# Patient Record
Sex: Female | Born: 1960
Health system: Southern US, Community
[De-identification: ages and names within clinical notes are randomized; demographics above are authoritative.]

## PROBLEM LIST (undated history)

## (undated) DIAGNOSIS — E785 Hyperlipidemia, unspecified: Secondary | ICD-10-CM

## (undated) DIAGNOSIS — I10 Essential (primary) hypertension: Secondary | ICD-10-CM

## (undated) DIAGNOSIS — R7303 Prediabetes: Secondary | ICD-10-CM

## (undated) HISTORY — DX: Prediabetes: R73.03

## (undated) HISTORY — DX: Hyperlipidemia, unspecified: E78.5

## (undated) HISTORY — PX: OTHER SURGICAL HISTORY: SHX169

---

## 1999-06-19 ENCOUNTER — Other Ambulatory Visit: Admission: RE | Admit: 1999-06-19 | Discharge: 1999-06-19 | Payer: Self-pay | Admitting: Obstetrics and Gynecology

## 2001-03-26 ENCOUNTER — Other Ambulatory Visit: Admission: RE | Admit: 2001-03-26 | Discharge: 2001-03-26 | Payer: Self-pay | Admitting: Obstetrics and Gynecology

## 2002-03-28 ENCOUNTER — Other Ambulatory Visit: Admission: RE | Admit: 2002-03-28 | Discharge: 2002-03-28 | Payer: Self-pay | Admitting: Obstetrics and Gynecology

## 2003-09-18 ENCOUNTER — Other Ambulatory Visit: Admission: RE | Admit: 2003-09-18 | Discharge: 2003-09-18 | Payer: Self-pay | Admitting: Obstetrics and Gynecology

## 2003-09-28 ENCOUNTER — Encounter: Admission: RE | Admit: 2003-09-28 | Discharge: 2003-09-28 | Payer: Self-pay | Admitting: Obstetrics and Gynecology

## 2003-10-28 ENCOUNTER — Emergency Department (HOSPITAL_COMMUNITY): Admission: EM | Admit: 2003-10-28 | Discharge: 2003-10-28 | Payer: Self-pay | Admitting: Emergency Medicine

## 2004-12-23 ENCOUNTER — Other Ambulatory Visit: Admission: RE | Admit: 2004-12-23 | Discharge: 2004-12-23 | Payer: Self-pay | Admitting: Obstetrics and Gynecology

## 2006-02-13 ENCOUNTER — Emergency Department (HOSPITAL_COMMUNITY): Admission: EM | Admit: 2006-02-13 | Discharge: 2006-02-13 | Payer: Self-pay | Admitting: Emergency Medicine

## 2006-02-17 ENCOUNTER — Ambulatory Visit (HOSPITAL_COMMUNITY): Admission: RE | Admit: 2006-02-17 | Discharge: 2006-02-17 | Payer: Self-pay | Admitting: Emergency Medicine

## 2007-04-11 IMAGING — US US ABDOMEN COMPLETE
1 series · 14 of 25 positions shown · non-contrast
Comparison: None.

CLINICAL DATA: 45-year-old, abdominal pain, nausea and vomiting.
 ABDOMEN ULTRASOUND:
TECHNIQUE: Complete abdominal ultrasound examination was performed including evaluation of the liver, gallbladder, bile ducts, pancreas, kidneys, spleen, IVC, and abdominal aorta.

[Series 1: unknown · 0.30mm/px · 14 of 57 slices shown]
[im 1/57]
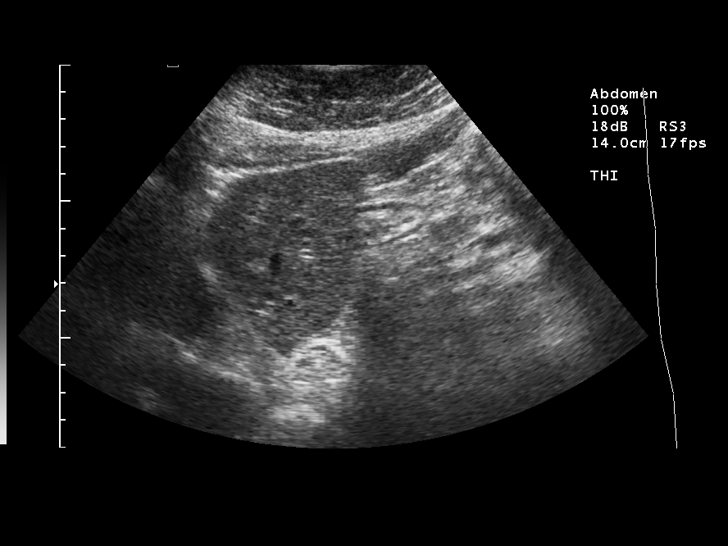
[im 5/57]
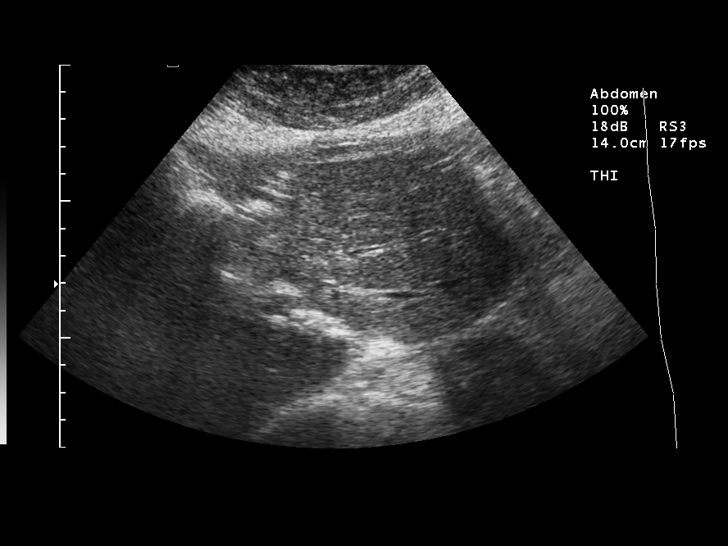
[im 10/57]
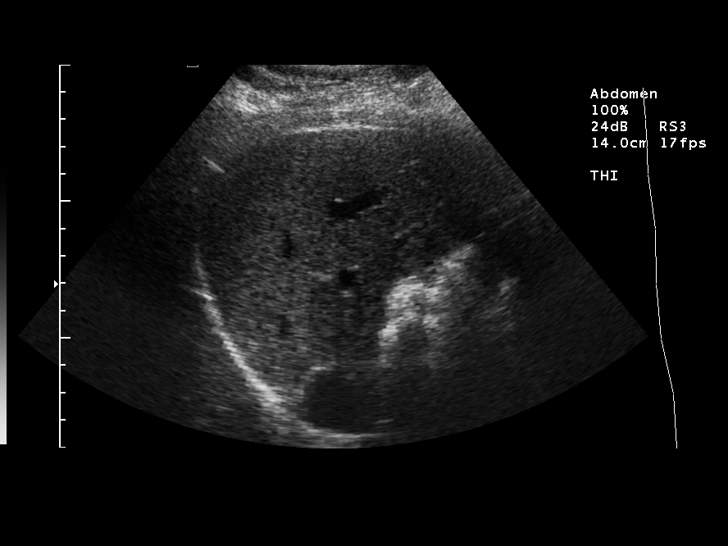
[im 15/57]
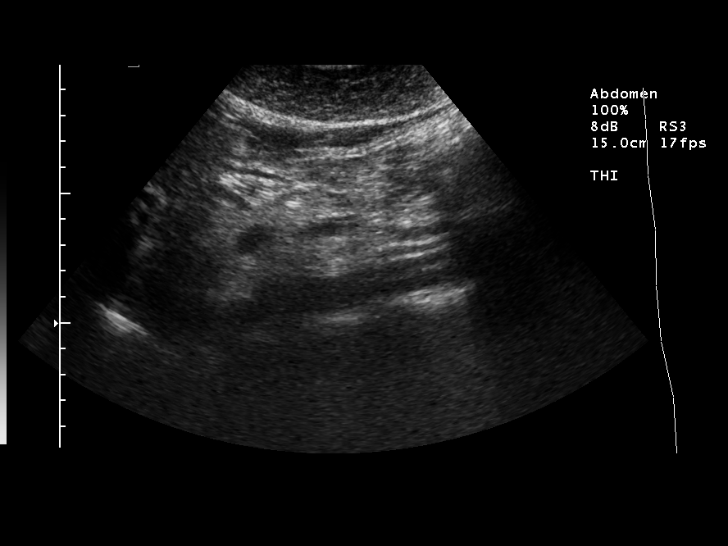
[im 19/57]
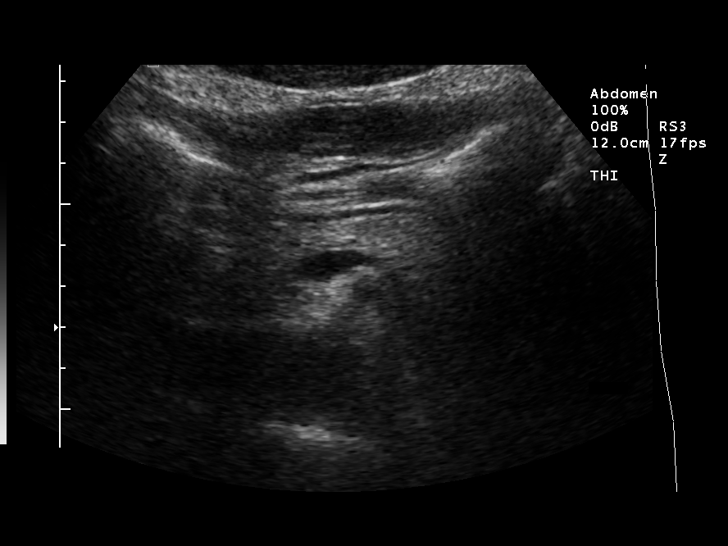
[im 22/57]
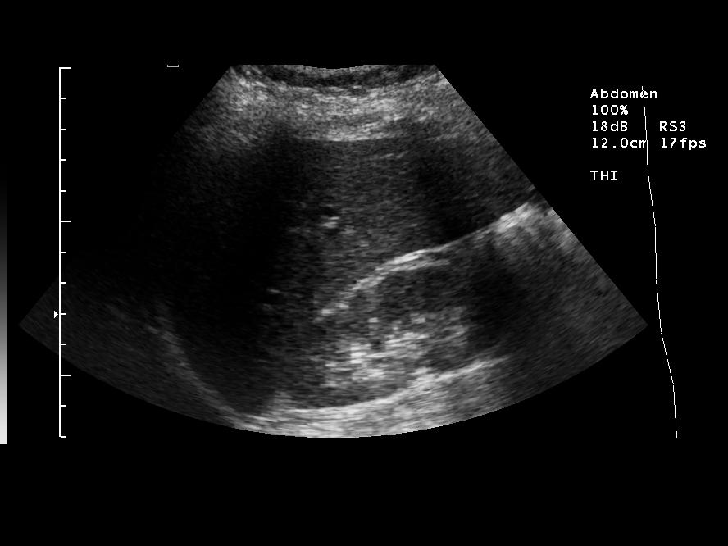
[im 26/57]
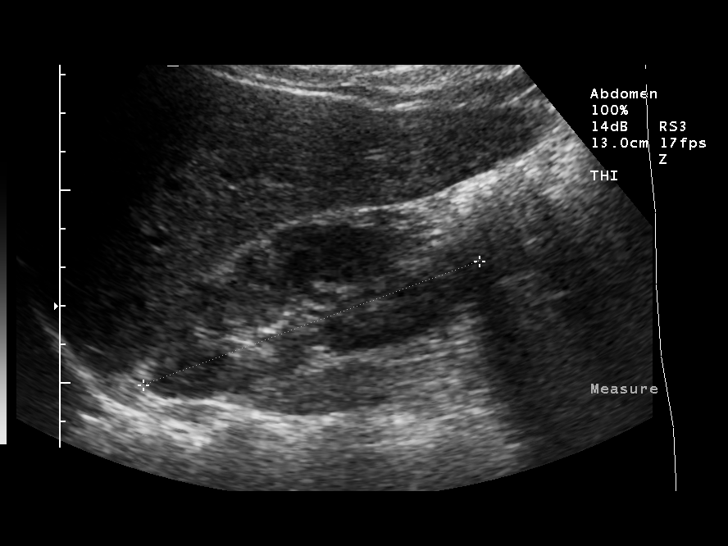
[im 31/57]
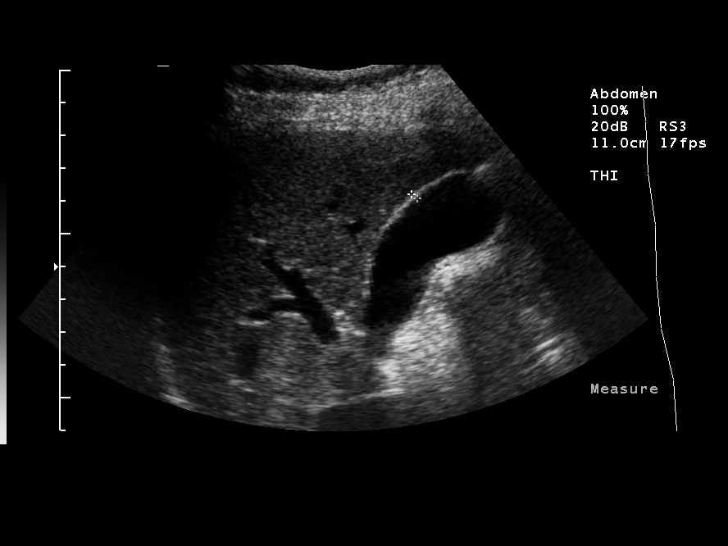
[im 36/57]
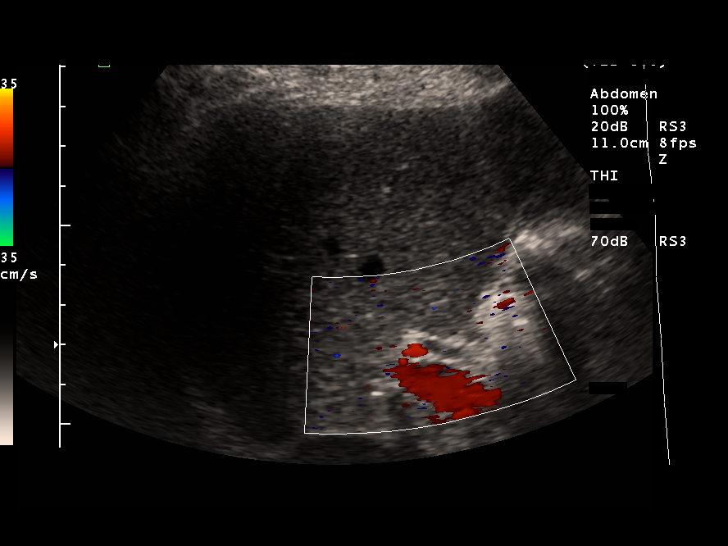
[im 38/57]
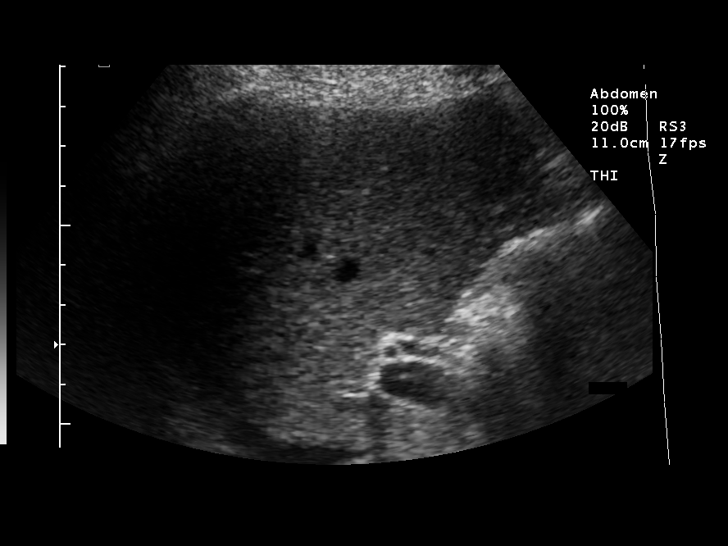
[im 43/57]
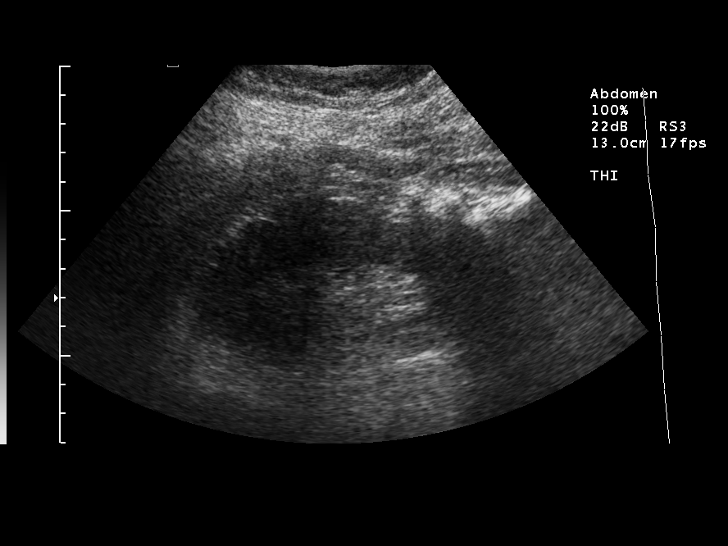
[im 47/57]
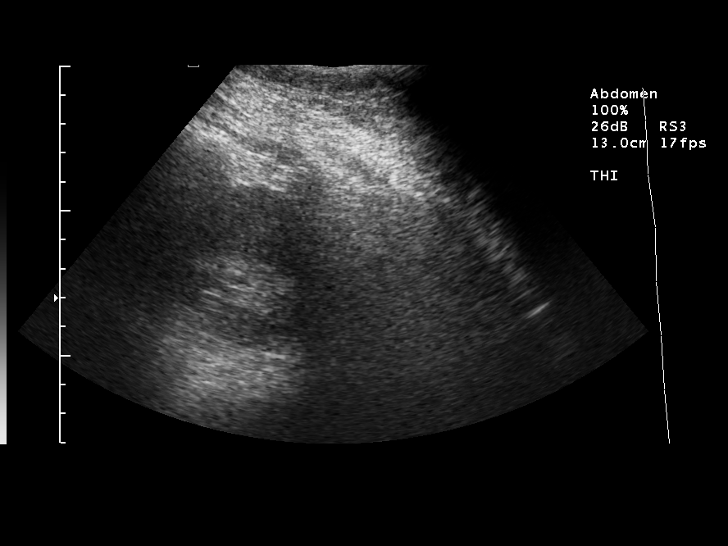
[im 52/57]
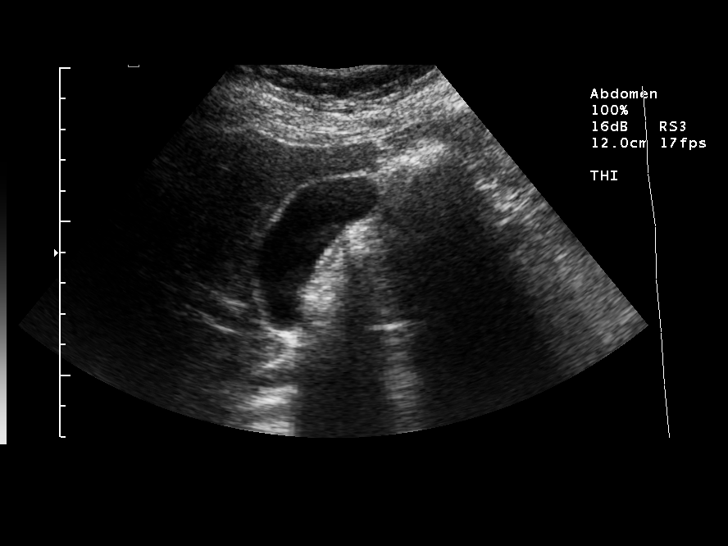
[im 57/57]
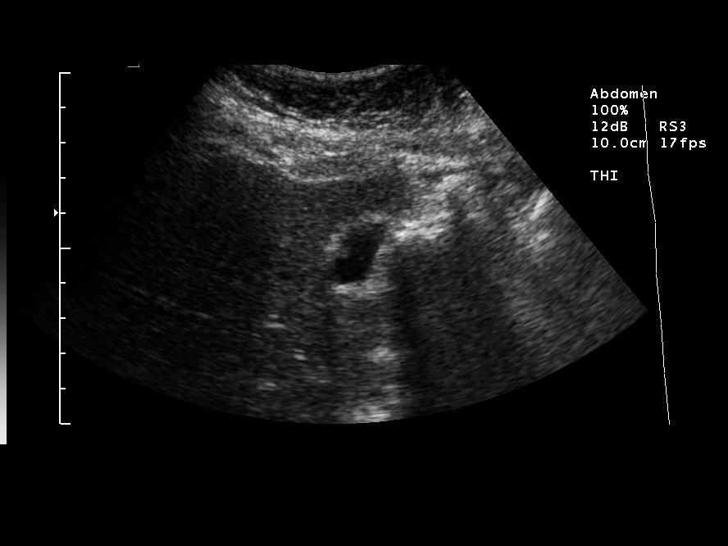

[14 of 25 positions shown; findings below may reference images not displayed]

FINDINGS: Liver is sonographically unremarkable.  No focal hepatic lesions or intrahepatic ductal dilatation.  The common bile duct measures 4.2 mm, which is within normal limits.  There is a small gallbladder polyp.  No gallstones, wall thickening, or pericholecystic fluid.  
 The IVC and the aorta are normal in caliber.  The pancreas is fairly well visualized and demonstrates no sonographic abnormalities.  
 Spleen measures 7.8 cm and demonstrates normal echogenicity.  The right kidney measures 9.3 cm and the left kidney measures 9.9 cm.  Both kidneys demonstrate normal echogenicity without focal lesions or hydronephrosis.  No perinephric fluid collections.
IMPRESSION: 1.  Unremarkable abdominal ultrasound examination.
 2.  Small gallbladder polyp.

## 2010-07-14 ENCOUNTER — Encounter: Payer: Self-pay | Admitting: Obstetrics and Gynecology

## 2011-07-01 ENCOUNTER — Other Ambulatory Visit: Payer: Self-pay | Admitting: Obstetrics and Gynecology

## 2011-07-01 DIAGNOSIS — Z1231 Encounter for screening mammogram for malignant neoplasm of breast: Secondary | ICD-10-CM

## 2011-08-11 ENCOUNTER — Ambulatory Visit
Admission: RE | Admit: 2011-08-11 | Discharge: 2011-08-11 | Disposition: A | Discharge status: Home / Self Care | Payer: 59 | Source: Ambulatory Visit | Attending: Obstetrics and Gynecology | Admitting: Obstetrics and Gynecology

## 2011-08-11 DIAGNOSIS — Z1231 Encounter for screening mammogram for malignant neoplasm of breast: Secondary | ICD-10-CM

## 2012-06-29 ENCOUNTER — Ambulatory Visit: Payer: Self-pay | Admitting: Obstetrics and Gynecology

## 2012-07-02 ENCOUNTER — Other Ambulatory Visit: Payer: Self-pay | Admitting: Obstetrics and Gynecology

## 2012-07-05 NOTE — Telephone Encounter (Signed)
PT NEED TO SCHEDULE AEX

## 2016-08-18 DIAGNOSIS — Z6841 Body Mass Index (BMI) 40.0 and over, adult: Secondary | ICD-10-CM | POA: Diagnosis not present

## 2016-08-18 DIAGNOSIS — L648 Other androgenic alopecia: Secondary | ICD-10-CM | POA: Diagnosis not present

## 2016-08-18 DIAGNOSIS — Z1211 Encounter for screening for malignant neoplasm of colon: Secondary | ICD-10-CM | POA: Diagnosis not present

## 2016-08-18 DIAGNOSIS — Z124 Encounter for screening for malignant neoplasm of cervix: Secondary | ICD-10-CM | POA: Diagnosis not present

## 2016-08-18 DIAGNOSIS — Z01411 Encounter for gynecological examination (general) (routine) with abnormal findings: Secondary | ICD-10-CM | POA: Diagnosis not present

## 2016-10-01 ENCOUNTER — Ambulatory Visit (INDEPENDENT_AMBULATORY_CARE_PROVIDER_SITE_OTHER): Payer: 59 | Admitting: Family Medicine

## 2016-10-01 VITALS — BP 167/89 | HR 67 | Temp 97.9°F | Resp 18 | Ht 58.25 in | Wt 208.0 lb

## 2016-10-01 DIAGNOSIS — M62838 Other muscle spasm: Secondary | ICD-10-CM

## 2016-10-01 MED ORDER — BACLOFEN 10 MG PO TABS: 10.0000 mg | ORAL_TABLET | Freq: Three times a day (TID) | ORAL | 0 refills | Status: DC | PRN

## 2016-10-01 NOTE — Progress Notes (Signed)
   Nancy Osborne is a 56 y.o. female who presents to Primary Care at [facility] today for upper back pain  1.  Back pain: Monday she noted pain in upper back and shoulders. Feels like pulling and stretching. Pain is 9/10, with Aleve it goes down to a 2/10.  She notes rotating her Osborne makes the pain worse, she feels slightly warm and diaphoretic during this movement. No radiation of pain.  No weakness/numbness of arms.  No pain at night, only during the daytime when she's active.  She works in a [employer] in June 2017.  Hasn't tried anything else for the pain.   ROS as above.  Pertinently, no chest pain, palpitations, SOB, Fever, Chills, Abd pain, N/V/D.   PMH reviewed. No significant PMHx, pt denies h/o HTN and notes elevation of BP today due to pain. Notes BP normal at gyn appt in Jan of this year.   Social: Patient is a nonsmoker, no drug use, no alcohol use.  Surgical: no surgical history  Family healthy  Medications reviewed. Current Outpatient Prescriptions  Medication Sig Dispense Refill  . naproxen sodium (ANAPROX) 220 MG tablet Take 220 mg by mouth 2 (two) times daily with a meal.    . baclofen (LIORESAL) 10 MG tablet Take 1 tablet (10 mg total) by mouth 3 (three) times daily as needed for muscle spasms. 30 each 0   No current facility-administered medications for this visit.      Physical Exam:  BP (!) 167/89   Pulse 67   Temp 97.9 F (36.6 C) (Oral)   Resp 18   Ht 4' 10.25" (1.48 m)   Wt 208 lb (94.3 kg)   LMP 07/21/2011   SpO2 97%   BMI 43.10 kg/m  Gen:  Alert, cooperative patient who appears stated age in no acute distress.  Vital signs reviewed. HEENT: EOMI,  MMM Pulm:  Clear to auscultation bilaterally with good air movement.  No wheezes or rales noted.   Cardiac:  Regular rate and rhythm without murmur auscultated.  Good S1/S2. Abd:  Soft/nondistended/nontender.  Good bowel sounds throughout all four quadrants.  No masses noted.  Exts:  Non edematous BL  LE, warm and well perfused.  Back: normal to inspection. Mild tenderness and spacticity to overlying midline tissue and cervical paraspinal muscles. Full ROM in shoulders. Pain with raising arms in front of her, but not with abduction. 5/5 strength in UEs. Normal/symmetric biceps reflexes. Sensation intact. Negative Spurlings.   Assessment and Plan:  1.  Previously healthy 56 y/o presenting for upper back pain that is pulling/stretching in nature. Exam consistent with muscle spasms. No red flags on history or exam. Despite her feelings of warmth, no evidence of infection (no overlying warmth, tenderness) Discussed stretches, heat and cold treatment, rest, NSAIDs as needed. Rx for baclofen, advised it makes some drowsy so she should initially take it in the evening to see how it affects her. Return precautions discussed.  2. Elevated BP: new per patient. Asymptomatic. Most likely secondary to pain. Advised her to f/u with primary care physician to monitor BP once pain has improved.  Joanna Puff, MD [facility] Resident  10/01/2016, 5:06 PM

## 2016-10-01 NOTE — Patient Instructions (Signed)
Continue to take Aleve as needed for pain.  I have prescribed baclofen to help with the muscle spasms. This medication can make some people drowsy, so take it at night for the first time to see how it affects to.  Continue to do the stretches that I showed you.  Applying heat may help with the pain as well.  If your symptoms felt to improve or worsen, follow-up with [place].  Continue to monitor your blood pressure, if you noted does not improve with improvement in your pain please follow-up with her primary care doctor.   Spasticity Spasticity is a condition in which your muscles contract suddenly and unpredictably (spasm). Spasticity usually affects your arms, legs, or back. It can also affect your speech and the way you walk. Spasticity can range from mild muscle stiffness and tightness to severe, uncontrollable muscle spasms. Severe spasticity can be painful and can freeze your muscles in an uncomfortable position. Follow these instructions at home: Watch your condition for any changes. The following actions may help to lessen any discomfort you are feeling:  Stay active.  Maintain good posture when walking and sitting.  Work with a physical therapist.  Do stretching and range of motion exercises at home as told by a physical therapist.  Work with an occupational therapist. This type of health care provider can help you function better at home and at work.  Wear a brace as told by your health care provider to prevent muscle contractions.  Have the affected muscles massaged.  If directed, apply ice to the affected muscle area:  Put ice in a plastic bag.  Place a towel between your skin and the bag or between your brace and the bag.  Leave the ice on for 20 minutes, 2?3 times a day. Contact a health care provider if:   Your muscle spasms get worse.  You develop other symptoms along with spasticity.  You have a fever or chills.  You have trouble passing urine or you  experience a burning feeling when you pass urine.  You become constipated.  You need more support at home. Get help right away if:  You have trouble breathing.  You have a muscle spasm that freezes you into a painful position.  You cannot walk.  You cannot care for yourself at home. This information is not intended to replace advice given to you by your health care provider. Make sure you discuss any questions you have with your health care provider. Document Released: 05/30/2002 Document Revised: 02/04/2016 Document Reviewed: 08/23/2013 Elsevier Interactive Patient Education  2017 [employer].

## 2016-10-21 ENCOUNTER — Encounter: Payer: Self-pay | Admitting: Student

## 2016-10-21 ENCOUNTER — Ambulatory Visit (INDEPENDENT_AMBULATORY_CARE_PROVIDER_SITE_OTHER): Payer: 59 | Admitting: Student

## 2016-10-21 VITALS — BP 178/102 | HR 70 | Temp 98.5°F | Resp 16 | Ht 58.5 in | Wt 206.0 lb

## 2016-10-21 DIAGNOSIS — Z131 Encounter for screening for diabetes mellitus: Secondary | ICD-10-CM | POA: Insufficient documentation

## 2016-10-21 DIAGNOSIS — Z13220 Encounter for screening for lipoid disorders: Secondary | ICD-10-CM | POA: Diagnosis not present

## 2016-10-21 DIAGNOSIS — I10 Essential (primary) hypertension: Secondary | ICD-10-CM

## 2016-10-21 LAB — POCT URINALYSIS DIP (MANUAL ENTRY)
Bilirubin, UA: NEGATIVE
Blood, UA: NEGATIVE
Glucose, UA: NEGATIVE mg/dL
Ketones, POC UA: NEGATIVE mg/dL
Leukocytes, UA: NEGATIVE
Nitrite, UA: NEGATIVE
PH UA: 7 (ref 5.0–8.0)
PROTEIN UA: NEGATIVE mg/dL
SPEC GRAV UA: 1.02 (ref 1.010–1.025)
UROBILINOGEN UA: 1 U/dL

## 2016-10-21 MED ORDER — HYDROCHLOROTHIAZIDE 25 MG PO TABS: 25.0000 mg | ORAL_TABLET | Freq: Every day | ORAL | 3 refills | Status: DC

## 2016-10-21 NOTE — Progress Notes (Signed)
   Subjective:    Patient ID: Nancy Osborne, female    DOB: 20-Dec-1960, 56 y.o.   MRN: [id number]  HPI Follow up for having high blood pressure on her last visit.   She denies chest pain, shortness of breath, visual changes, weakness, numbness, tingling, palpitations.  She was seen at her last visit here and told to come back for BP recheck.  Had a job interview and found to have BP in the 200's systolic.  She denies ever having symptoms.   She cooks mostly at home and does not add salt.  She is obese and wants to lose weight.  She use to have her lab work checked at another office and never had abnormal lab results.  She denies having diabetes and did have her A1c checked last year.     PMHx - Updated and reviewed.  Contributory factors include: Negative PSHx - Updated and reviewed.  Contributory factors include:  Negative FHx - Updated and reviewed.  Contributory factors include:  Negative Social Hx - Updated and reviewed. Contributory factors include: nonsmoker, drinks occasionally, no recreational drug use Medications - reviewed   Review of Systems  Constitutional: Negative for chills, fatigue and fever.  Eyes: Negative for pain and visual disturbance.  Respiratory: Negative for cough, chest tightness and shortness of breath.   Cardiovascular: Negative for chest pain, palpitations and leg swelling.  Gastrointestinal: Negative for abdominal pain and nausea.  Genitourinary: Negative for dysuria and urgency.  Musculoskeletal: Negative for arthralgias and joint swelling.  Skin: Negative for rash and wound.  Neurological: Negative for dizziness, syncope, weakness and numbness.  Psychiatric/Behavioral: Negative for agitation and confusion.  All other systems reviewed and are negative.      Objective:   Physical Exam  Constitutional: She is oriented to person, place, and time. She appears well-developed and well-nourished. No distress.  HENT:  Head: Normocephalic and atraumatic.  Right  Ear: External ear normal.  Left Ear: External ear normal.  Eyes: Conjunctivae are normal. Pupils are equal, round, and reactive to light.  Neck: Normal range of motion. Neck supple. No JVD present.  Cardiovascular: Normal rate, regular rhythm, normal heart sounds and intact distal pulses.  Exam reveals no gallop and no friction rub.   No murmur heard. Pulmonary/Chest: Effort normal and breath sounds normal. No respiratory distress. She has no wheezes. She has no rales. She exhibits no tenderness.  Musculoskeletal: Normal range of motion. She exhibits no edema.  Neurological: She is alert and oriented to person, place, and time.  Skin: Skin is warm. No rash noted. She is not diaphoretic. No erythema.  Psychiatric: She has a normal mood and affect. Her behavior is normal. Judgment and thought content normal.  Nursing note and vitals reviewed.  BP (!) 178/102   Pulse 70   Temp 98.5 F (36.9 C) (Oral)   Resp 16   Ht 4' 10.5" (1.486 m)   Wt 206 lb (93.4 kg)   LMP 07/21/2011   SpO2 98%   BMI 42.32 kg/m         Assessment & Plan:  Essential hypertension Recommend starting HCTZ at this time.  She will get screening labs and follow up in 2 weeks to recheck kidneys and response to medication.  Diet reviewed. Wrote note to work, no heavy lifting.  May need to add second medication if not controlled in 2 weeks.  Signed,  Corliss Marcus, DO  Sports Medicine [facility] 5:21 PM 10/21/16

## 2016-10-21 NOTE — Assessment & Plan Note (Addendum)
Recommend starting HCTZ at this time.  She will get screening labs and follow up in 2 weeks to recheck kidneys and response to medication.  Diet reviewed. Wrote note to work, no heavy lifting.  May need to add second medication if not controlled in 2 weeks. Reviewed disease process and prevention of tertiary illness.

## 2016-10-21 NOTE — Patient Instructions (Addendum)
   IF you received an x-ray today, you will receive an invoice from [facility]. Please contact [facility] at [phone] with questions or concerns regarding your invoice.   IF you received labwork today, you will receive an invoice from [facility]. Please contact [facility] at [phone] with questions or concerns regarding your invoice.   Our billing staff will not be able to assist you with questions regarding bills from these companies.  You will be contacted with the lab results as soon as they are available. The fastest way to get your results is to activate your My Chart account. Instructions are located on the last page of this paperwork. If you have not heard from us regarding the results in 2 weeks, please contact this office.     DASH Eating Plan DASH stands for "Dietary Approaches to Stop Hypertension." The DASH eating plan is a healthy eating plan that has been shown to reduce high blood pressure (hypertension). It may also reduce your risk for type 2 diabetes, heart disease, and stroke. The DASH eating plan may also help with weight loss. What are tips for following this plan? General guidelines   Avoid eating more than 2,300 mg (milligrams) of salt (sodium) a day. If you have hypertension, you may need to reduce your sodium intake to 1,500 mg a day.  Limit alcohol intake to no more than 1 drink a day for nonpregnant women and 2 drinks a day for men. One drink equals 12 oz of beer, 5 oz of wine, or 1 oz of hard liquor.  Work with your health care provider to maintain a healthy body weight or to lose weight. Ask what an ideal weight is for you.  Get at least 30 minutes of exercise that causes your heart to beat faster (aerobic exercise) most days of the week. Activities may include walking, swimming, or biking.  Work with your health care provider or diet and nutrition specialist (dietitian) to adjust your eating plan to your individual calorie  needs. Reading food labels   Check food labels for the amount of sodium per serving. Choose foods with less than 5 percent of the Daily Value of sodium. Generally, foods with less than 300 mg of sodium per serving fit into this eating plan.  To find whole grains, look for the word "whole" as the first word in the ingredient list. Shopping   Buy products labeled as "low-sodium" or "no salt added."  Buy fresh foods. Avoid canned foods and premade or frozen meals. Cooking   Avoid adding salt when cooking. Use salt-free seasonings or herbs instead of table salt or sea salt. Check with your health care provider or pharmacist before using salt substitutes.  Do not fry foods. Cook foods using healthy methods such as baking, boiling, grilling, and broiling instead.  Cook with heart-healthy oils, such as olive, canola, soybean, or sunflower oil. Meal planning    Eat a balanced diet that includes:  5 or more servings of fruits and vegetables each day. At each meal, try to fill half of your plate with fruits and vegetables.  Up to 6-8 servings of whole grains each day.  Less than 6 oz of lean meat, poultry, or fish each day. A 3-oz serving of meat is about the same size as a deck of cards. One egg equals 1 oz.  2 servings of low-fat dairy each day.  A serving of nuts, seeds, or beans 5 times each week.  Heart-healthy fats. Healthy fats called   Omega-3 fatty acids are found in foods such as flaxseeds and coldwater fish, like sardines, salmon, and mackerel.  Limit how much you eat of the following:  Canned or prepackaged foods.  Food that is high in trans fat, such as fried foods.  Food that is high in saturated fat, such as fatty meat.  Sweets, desserts, sugary drinks, and other foods with added sugar.  Full-fat dairy products.  Do not salt foods before eating.  Try to eat at least 2 vegetarian meals each week.  Eat more home-cooked food and less restaurant, buffet, and fast  food.  When eating at a restaurant, ask that your food be prepared with less salt or no salt, if possible. What foods are recommended? The items listed may not be a complete list. Talk with your dietitian about what dietary choices are best for you. Grains  Whole-grain or whole-wheat bread. Whole-grain or whole-wheat pasta. Brown rice. Oatmeal. Quinoa. Bulgur. Whole-grain and low-sodium cereals. Pita bread. Low-fat, low-sodium crackers. Whole-wheat flour tortillas. Vegetables  Fresh or frozen vegetables (raw, steamed, roasted, or grilled). Low-sodium or reduced-sodium tomato and vegetable juice. Low-sodium or reduced-sodium tomato sauce and tomato paste. Low-sodium or reduced-sodium canned vegetables. Fruits  All fresh, dried, or frozen fruit. Canned fruit in natural juice (without added sugar). Meat and other protein foods  Skinless chicken or turkey. Ground chicken or turkey. Pork with fat trimmed off. Fish and seafood. Egg whites. Dried beans, peas, or lentils. Unsalted nuts, nut butters, and seeds. Unsalted canned beans. Lean cuts of beef with fat trimmed off. Low-sodium, lean deli meat. Dairy  Low-fat (1%) or fat-free (skim) milk. Fat-free, low-fat, or reduced-fat cheeses. Nonfat, low-sodium ricotta or cottage cheese. Low-fat or nonfat yogurt. Low-fat, low-sodium cheese. Fats and oils  Soft margarine without trans fats. Vegetable oil. Low-fat, reduced-fat, or light mayonnaise and salad dressings (reduced-sodium). Canola, safflower, olive, soybean, and sunflower oils. Avocado. Seasoning and other foods  Herbs. Spices. Seasoning mixes without salt. Unsalted popcorn and pretzels. Fat-free sweets. What foods are not recommended? The items listed may not be a complete list. Talk with your dietitian about what dietary choices are best for you. Grains  Baked goods made with fat, such as croissants, muffins, or some breads. Dry pasta or rice meal packs. Vegetables  Creamed or fried vegetables.  Vegetables in a cheese sauce. Regular canned vegetables (not low-sodium or reduced-sodium). Regular canned tomato sauce and paste (not low-sodium or reduced-sodium). Regular tomato and vegetable juice (not low-sodium or reduced-sodium). Pickles. Olives. Fruits  Canned fruit in a light or heavy syrup. Fried fruit. Fruit in cream or butter sauce. Meat and other protein foods  Fatty cuts of meat. Ribs. Fried meat. Bacon. Sausage. Bologna and other processed lunch meats. Salami. Fatback. Hotdogs. Bratwurst. Salted nuts and seeds. Canned beans with added salt. Canned or smoked fish. Whole eggs or egg yolks. Chicken or turkey with skin. Dairy  Whole or 2% milk, cream, and half-and-half. Whole or full-fat cream cheese. Whole-fat or sweetened yogurt. Full-fat cheese. Nondairy creamers. Whipped toppings. Processed cheese and cheese spreads. Fats and oils  Butter. Stick margarine. Lard. Shortening. Ghee. Bacon fat. Tropical oils, such as coconut, palm kernel, or palm oil. Seasoning and other foods  Salted popcorn and pretzels. Onion salt, garlic salt, seasoned salt, table salt, and sea salt. Worcestershire sauce. Tartar sauce. Barbecue sauce. Teriyaki sauce. Soy sauce, including reduced-sodium. Steak sauce. Canned and packaged gravies. Fish sauce. Oyster sauce. Cocktail sauce. Horseradish that you find on the shelf. Ketchup. Mustard. Meat flavorings   and tenderizers. Bouillon cubes. Hot sauce and Tabasco sauce. Premade or packaged marinades. Premade or packaged taco seasonings. Relishes. Regular salad dressings. Where to find more information:  National Heart, Lung, and Blood Institute: www.nhlbi.nih.gov  American Heart Association: www.heart.org Summary  The DASH eating plan is a healthy eating plan that has been shown to reduce high blood pressure (hypertension). It may also reduce your risk for type 2 diabetes, heart disease, and stroke.  With the DASH eating plan, you should limit salt (sodium) intake  to 2,300 mg a day. If you have hypertension, you may need to reduce your sodium intake to 1,500 mg a day.  When on the DASH eating plan, aim to eat more fresh fruits and vegetables, whole grains, lean proteins, low-fat dairy, and heart-healthy fats.  Work with your health care provider or diet and nutrition specialist (dietitian) to adjust your eating plan to your individual calorie needs. This information is not intended to replace advice given to you by your health care provider. Make sure you discuss any questions you have with your health care provider. Document Released: 05/29/2011 Document Revised: 06/02/2016 Document Reviewed: 06/02/2016 Elsevier Interactive Patient Education  2017 [employer]  

## 2016-10-22 LAB — COMPREHENSIVE METABOLIC PANEL
A/G RATIO: 1.4 (ref 1.2–2.2)
ALK PHOS: 108 IU/L (ref 39–117)
ALT: 23 IU/L (ref 0–32)
AST: 22 IU/L (ref 0–40)
Albumin: 4.3 g/dL (ref 3.5–5.5)
BILIRUBIN TOTAL: 0.4 mg/dL (ref 0.0–1.2)
BUN/Creatinine Ratio: 15 (ref 9–23)
BUN: 14 mg/dL (ref 6–24)
CALCIUM: 9.8 mg/dL (ref 8.7–10.2)
CHLORIDE: 98 mmol/L (ref 96–106)
CO2: 28 mmol/L (ref 18–29)
Creatinine, Ser: 0.92 mg/dL (ref 0.57–1.00)
GFR calc Af Amer: 80 mL/min/{1.73_m2} (ref >59)
GFR, EST NON AFRICAN AMERICAN: 70 mL/min/{1.73_m2} (ref >59)
GLOBULIN, TOTAL: 3.1 g/dL (ref 1.5–4.5)
GLUCOSE: 99 mg/dL (ref 65–99)
POTASSIUM: 3.9 mmol/L (ref 3.5–5.2)
SODIUM: 142 mmol/L (ref 134–144)
TOTAL PROTEIN: 7.4 g/dL (ref 6.0–8.5)

## 2016-10-22 LAB — CBC WITH DIFFERENTIAL/PLATELET
BASOS ABS: 0 10*3/uL (ref 0.0–0.2)
BASOS: 0 %
EOS (ABSOLUTE): 0.1 10*3/uL (ref 0.0–0.4)
Eos: 1 %
Hematocrit: 40.7 % (ref 34.0–46.6)
Hemoglobin: 13.2 g/dL (ref 11.1–15.9)
IMMATURE GRANS (ABS): 0 10*3/uL (ref 0.0–0.1)
IMMATURE GRANULOCYTES: 0 %
LYMPHS: 36 %
Lymphocytes Absolute: 2.3 10*3/uL (ref 0.7–3.1)
MCH: 26.7 pg (ref 26.6–33.0)
MCHC: 32.4 g/dL (ref 31.5–35.7)
MCV: 82 fL (ref 79–97)
MONOS ABS: 0.4 10*3/uL (ref 0.1–0.9)
Monocytes: 6 %
NEUTROS PCT: 57 %
Neutrophils Absolute: 3.6 10*3/uL (ref 1.4–7.0)
PLATELETS: 327 10*3/uL (ref 150–379)
RBC: 4.94 x10E6/uL (ref 3.77–5.28)
RDW: 15.7 % — AB (ref 12.3–15.4)
WBC: 6.4 10*3/uL (ref 3.4–10.8)

## 2016-10-22 LAB — HEMOGLOBIN A1C
Est. average glucose Bld gHb Est-mCnc: 131 mg/dL
HEMOGLOBIN A1C: 6.2 % — AB (ref 4.8–5.6)

## 2016-10-22 LAB — LIPID PANEL
CHOL/HDL RATIO: 2.7 ratio (ref 0.0–4.4)
CHOLESTEROL TOTAL: 223 mg/dL — AB (ref 100–199)
HDL: 82 mg/dL (ref >39)
LDL CALC: 131 mg/dL — AB (ref 0–99)
Triglycerides: 48 mg/dL (ref 0–149)
VLDL Cholesterol Cal: 10 mg/dL (ref 5–40)

## 2016-10-27 ENCOUNTER — Telehealth: Payer: Self-pay

## 2016-10-27 NOTE — Telephone Encounter (Signed)
PATIENT SAW DR. Cardell PeachALICIA CHITANAND LAST TUES. (10/21/16) FOR HER HIGH BLOOD PRESSURE. SHE NEEDED TO HAVE A NOTE FAXED TO HER [employer] SAYING SHE IS UNDER A DOCTOR'S CARE FOR HER BLOOD PRESSURE. INSTEAD THE NOTE PUT HER ON RESTRICTIONS. SHE IS TRYING TO START THIS NEW JOB ON MON. 11/03/16 AND WITH THESE RESTRICTIONS IT IS HOLDING HER UP FROM GETTING THE JOB. PLEASE FAX A NEW NOTE TO HER COMPANY. FAX # [phone] (ATTN) RONDA MORRIS AT [facility] PLEASE CALL THE PATIENT WHEN IT HAS BEEN FAXED [phone] (CELL) MBC

## 2016-10-27 NOTE — Telephone Encounter (Signed)
Will  You write in your care but no restrictions?

## 2016-10-28 NOTE — Telephone Encounter (Signed)
Pt advised and tx up front for appt Saturday for lab and b/p check

## 2016-10-28 NOTE — Telephone Encounter (Signed)
Lenward ChancellorHey Karen,  I will lift restrictions if she comes back for a BP recheck and follow up labs.  Can she come in for an appt to get BP recheck and labs this week?  I do not think she is safe to lift heavy objects until her BP comes down.  But I am sure it is better with BP meds, but want to be sure before being cleared.

## 2016-11-01 ENCOUNTER — Ambulatory Visit (INDEPENDENT_AMBULATORY_CARE_PROVIDER_SITE_OTHER): Payer: 59 | Admitting: Physician Assistant

## 2016-11-01 ENCOUNTER — Encounter: Payer: Self-pay | Admitting: Physician Assistant

## 2016-11-01 VITALS — BP 178/118 | HR 95 | Temp 98.8°F | Resp 17 | Ht <= 58 in | Wt 206.2 lb

## 2016-11-01 DIAGNOSIS — I10 Essential (primary) hypertension: Secondary | ICD-10-CM | POA: Diagnosis not present

## 2016-11-01 MED ORDER — AMLODIPINE BESYLATE 10 MG PO TABS: 10.0000 mg | ORAL_TABLET | Freq: Every day | ORAL | 0 refills | Status: DC

## 2016-11-01 NOTE — Patient Instructions (Signed)
     IF you received an x-ray today, you will receive an invoice from [facility]. Please contact  Radiology at [phone] with questions or concerns regarding your invoice.   IF you received labwork today, you will receive an invoice from [facility]. Please contact [facility] at [phone] with questions or concerns regarding your invoice.   Our billing staff will not be able to assist you with questions regarding bills from these companies.  You will be contacted with the lab results as soon as they are available. The fastest way to get your results is to activate your My Chart account. Instructions are located on the last page of this paperwork. If you have not heard from us regarding the results in 2 weeks, please contact this office.     

## 2016-11-01 NOTE — Progress Notes (Signed)
   Nancy SinghDoris N Osborne  MRN: [id number] DOB: [birth year]  PCP: Nancy RiddleWeber, Duell Holdren L, PA-C  Chief Complaint  Patient presents with  . Hypertension    Subjective:  Pt presents to clinic for HTN evaluation.  She as seen on 5/1 for elevated BP and she was started on HCTZ at that visit.  She had not noticed much change in her BP but she has been urinating a lot.  She does not feel bad and does not have HA, nausea or vision changes or BP or SOB.  She has a family history with her mother and father of HTN.  She cooks at home and does not add salt.    Review of Systems  Constitutional: Negative for chills and fever.  Respiratory: Negative for shortness of breath.   Cardiovascular: Negative for chest pain, palpitations and leg swelling.  Neurological: Negative for headaches.    Patient Active Problem List   Diagnosis Date Noted  . Essential hypertension 10/21/2016  . Screening for diabetes mellitus 10/21/2016    Current Outpatient Prescriptions on File Prior to Visit  Medication Sig Dispense Refill  . baclofen (LIORESAL) 10 MG tablet Take 1 tablet (10 mg total) by mouth 3 (three) times daily as needed for muscle spasms. 30 each 0  . hydrochlorothiazide (HYDRODIURIL) 25 MG tablet Take 1 tablet (25 mg total) by mouth daily. 90 tablet 3  . naproxen sodium (ANAPROX) 220 MG tablet Take 220 mg by mouth 2 (two) times daily with a meal.     No current facility-administered medications on file prior to visit.     No Known Allergies  Pt patients past, family and social history were reviewed and updated.   Objective:  BP (!) 178/118   Pulse 95   Temp 98.8 F (37.1 C) (Oral)   Resp 17   Ht 4\' 10"  (1.473 m)   Wt 206 lb 4 oz (93.6 kg)   LMP 07/21/2011   SpO2 99%   BMI 43.11 kg/m   Physical Exam  Constitutional: She is oriented to person, place, and time and well-developed, well-nourished, and in no distress.  HENT:  Head: Normocephalic and atraumatic.  Right Ear: Hearing and external ear normal.    Left Ear: Hearing and external ear normal.  Eyes: Conjunctivae are normal.  Neck: Normal range of motion.  Cardiovascular: Normal rate, regular rhythm and normal heart sounds.   No murmur heard. Pulmonary/Chest: Effort normal and breath sounds normal.  Musculoskeletal:       Right lower leg: She exhibits no edema.       Left lower leg: She exhibits no edema.  Neurological: She is alert and oriented to person, place, and time. Gait normal.  Skin: Skin is warm and dry.  Psychiatric: Mood, memory, affect and judgment normal.  Vitals reviewed.  EKG interpration: Rate - 57, sinus rhythm, no acute changes Assessment and Plan :  Essential hypertension - Plan: TSH, EKG 12-Lead, amLODipine (NORVASC) 10 MG tablet   Check labs for TSH as that was not done at the last visit.  She will start Norvasc and monitor her BP at home and bring in her cuff to her next visit.  She is ok to stop the HCTZ as that is not a mediation that we will be continuing - consider chlorthalidone or lorsartan at f/u as she will likely need another agent.  Benny LennertSarah Janani Chamber PA-C  [facility] 11/03/2016 8:43 PM

## 2016-11-02 LAB — TSH: TSH: 1.62 u[IU]/mL (ref 0.450–4.500)

## 2016-11-04 ENCOUNTER — Encounter: Payer: Self-pay | Admitting: Radiology

## 2016-11-25 ENCOUNTER — Ambulatory Visit (INDEPENDENT_AMBULATORY_CARE_PROVIDER_SITE_OTHER): Payer: 59 | Admitting: Physician Assistant

## 2016-11-25 ENCOUNTER — Encounter: Payer: Self-pay | Admitting: Physician Assistant

## 2016-11-25 DIAGNOSIS — I10 Essential (primary) hypertension: Secondary | ICD-10-CM

## 2016-11-25 MED ORDER — AMLODIPINE BESYLATE 10 MG PO TABS: 10.0000 mg | ORAL_TABLET | Freq: Every day | ORAL | 0 refills | Status: DC

## 2016-11-25 NOTE — Progress Notes (Signed)
   Nancy Osborne  MRN: [id number] DOB: 1961/03/18  PCP: Nancy RiddleWeber, Sarah L, PA-C  Chief Complaint  Patient presents with  . Follow-up    on BP     Subjective:  Pt presents to clinic for BP recheck.  She feels so much better.  More energy and sleeping better since start of the Norvasc.  Review of Systems  Constitutional: Negative for chills and fever.  Respiratory: Negative for shortness of breath.   Cardiovascular: Negative for chest pain, palpitations and leg swelling.  Neurological: Negative for headaches.    Patient Active Problem List   Diagnosis Date Noted  . Essential hypertension 10/21/2016  . Screening for diabetes mellitus 10/21/2016    No current outpatient prescriptions on file prior to visit.   No current facility-administered medications on file prior to visit.     No Known Allergies  Pt patients past, family and social history were reviewed and updated.   Objective:  BP 128/83   Pulse 79   Temp 98.7 F (37.1 C) (Oral)   Resp 18   Ht 4\' 10"  (1.473 m)   Wt 210 lb (95.3 kg)   LMP 07/21/2011   SpO2 94%   BMI 43.89 kg/m   Physical Exam  Constitutional: She is oriented to person, place, and time and well-developed, well-nourished, and in no distress.  HENT:  Head: Normocephalic and atraumatic.  Right Ear: Hearing and external ear normal.  Left Ear: Hearing and external ear normal.  Eyes: Conjunctivae are normal.  Neck: Normal range of motion.  Cardiovascular: Normal rate, regular rhythm and normal heart sounds.   No murmur heard. Pulmonary/Chest: Effort normal and breath sounds normal.  Musculoskeletal:       Right lower leg: She exhibits no edema.       Left lower leg: She exhibits no edema.  Neurological: She is alert and oriented to person, place, and time. Gait normal.  Skin: Skin is warm and dry.  Psychiatric: Mood, memory, affect and judgment normal.  Vitals reviewed.   Assessment and Plan :  Essential hypertension - Plan: amLODipine  (NORVASC) 10 MG tablet well controlled - encouraged continued healthy eating and try to increase exercise to help with some weight loss.  Recheck in 3 months  Benny LennertSarah Weber PA-C  [facility] at [facility] 11/25/2016 5:35 PM

## 2016-11-25 NOTE — Patient Instructions (Signed)
     IF you received an x-ray today, you will receive an invoice from [facility]. Please contact [facility] at [phone] with questions or concerns regarding your invoice.   IF you received labwork today, you will receive an invoice from [facility]. Please contact [facility] at [phone] with questions or concerns regarding your invoice.   Our billing staff will not be able to assist you with questions regarding bills from these companies.  You will be contacted with the lab results as soon as they are available. The fastest way to get your results is to activate your My Chart account. Instructions are located on the last page of this paperwork. If you have not heard from us regarding the results in 2 weeks, please contact this office.     

## 2017-03-03 ENCOUNTER — Ambulatory Visit: Payer: 59 | Admitting: Physician Assistant

## 2017-03-17 ENCOUNTER — Encounter: Payer: Self-pay | Admitting: Physician Assistant

## 2017-03-17 ENCOUNTER — Ambulatory Visit (INDEPENDENT_AMBULATORY_CARE_PROVIDER_SITE_OTHER): Payer: 59 | Admitting: Physician Assistant

## 2017-03-17 VITALS — BP 138/100 | HR 60 | Temp 98.6°F | Resp 18 | Ht <= 58 in | Wt 208.4 lb

## 2017-03-17 DIAGNOSIS — Z23 Encounter for immunization: Secondary | ICD-10-CM | POA: Diagnosis not present

## 2017-03-17 DIAGNOSIS — I10 Essential (primary) hypertension: Secondary | ICD-10-CM

## 2017-03-17 MED ORDER — AMLODIPINE BESYLATE 10 MG PO TABS: 10.0000 mg | ORAL_TABLET | Freq: Every day | ORAL | 1 refills | Status: DC

## 2017-03-17 NOTE — Progress Notes (Signed)
   Nancy Osborne  MRN: [id number] DOB: [birth year]  PCP: Morrell Riddle, PA-C  Chief Complaint  Patient presents with  . Hypertension    follow up    Subjective:  Pt presents to clinic for medication refills.  She has not taken her BP medications in 3 days due to her work schedule.  She feels so much better since she started er medications and her BP has been controlled.  She has not made many changes to her lifestyle since her diagnosis of HTN and pre-diabetes.  History is obtained by patient.  Review of Systems  Constitutional: Negative for chills and fever.  Eyes: Negative for visual disturbance.  Respiratory: Negative for shortness of breath.   Cardiovascular: Negative for chest pain, palpitations and leg swelling.  Neurological: Negative for dizziness, light-headedness and headaches.    Patient Active Problem List   Diagnosis Date Noted  . Essential hypertension 10/21/2016  . Screening for diabetes mellitus 10/21/2016    No current outpatient prescriptions on file prior to visit.   No current facility-administered medications on file prior to visit.     No Known Allergies  No past medical history on file. Social History   Social History Narrative  . No narrative on file   Social History  Substance Use Topics  . Smoking status: Never Smoker  . Smokeless tobacco: Never Used  . Alcohol use No   family history is not on file.     Objective:  BP (!) 138/100   Pulse 60   Temp 98.6 F (37 C) (Oral)   Resp 18   Ht  (1.473 m)   Wt 208 lb 6.4 oz (94.5 kg)   LMP 07/21/2011   SpO2 97%   BMI 43.56 kg/m  Body mass index is 43.56 kg/m.  Physical Exam  Constitutional: She is oriented to person, place, and time and well-developed, well-nourished, and in no distress.  HENT:  Head: Normocephalic and atraumatic.  Right Ear: Hearing and external ear normal.  Left Ear: Hearing and external ear normal.  Eyes: Conjunctivae are normal.  Neck: Normal range of  motion.  Cardiovascular: Normal rate, regular rhythm and normal heart sounds.   No murmur heard. Pulmonary/Chest: Effort normal and breath sounds normal.  Musculoskeletal:       Right lower leg: She exhibits no edema.       Left lower leg: She exhibits no edema.  Neurological: She is alert and oriented to person, place, and time. Gait normal.  Skin: Skin is warm and dry.  Psychiatric: Mood, memory, affect and judgment normal.  Vitals reviewed.   Assessment and Plan :  Essential hypertension - Plan: amLODipine (NORVASC) 10 MG tablet, DISCONTINUED: amLODipine (NORVASC) 10 MG tablet  Need for influenza vaccination - Plan: Flu Vaccine QUAD 36+ mos IM  Continue current medications - f/u in 6 months at that time we will recheck A1C.  Benny Lennert PA-C  Primary Care at [facility] 03/17/2017 12:14 PM

## 2017-03-17 NOTE — Patient Instructions (Signed)
     IF you received an x-ray today, you will receive an invoice from [facility]. Please contact [facility] at [phone] with questions or concerns regarding your invoice.   IF you received labwork today, you will receive an invoice from [facility]. Please contact [facility] at [phone] with questions or concerns regarding your invoice.   Our billing staff will not be able to assist you with questions regarding bills from these companies.  You will be contacted with the lab results as soon as they are available. The fastest way to get your results is to activate your My Chart account. Instructions are located on the last page of this paperwork. If you have not heard from us regarding the results in 2 weeks, please contact this office.     

## 2017-03-23 MED FILL — AMLODIPINE BESYLATE 10 MG T: 10 | 90 days supply | Qty: 90 | Fill #0

## 2017-06-29 MED FILL — AMLODIPINE BESYLATE 10 MG T: 10 | 90 days supply | Qty: 90 | Fill #1

## 2017-09-15 ENCOUNTER — Other Ambulatory Visit: Payer: Self-pay

## 2017-09-15 ENCOUNTER — Ambulatory Visit: Payer: 59 | Admitting: Physician Assistant

## 2017-09-15 ENCOUNTER — Ambulatory Visit (INDEPENDENT_AMBULATORY_CARE_PROVIDER_SITE_OTHER): Payer: No Typology Code available for payment source | Admitting: Physician Assistant

## 2017-09-15 ENCOUNTER — Encounter: Payer: Self-pay | Admitting: Physician Assistant

## 2017-09-15 VITALS — BP 152/90 | HR 68 | Temp 98.1°F | Resp 18 | Ht <= 58 in | Wt 207.4 lb

## 2017-09-15 DIAGNOSIS — Z1211 Encounter for screening for malignant neoplasm of colon: Secondary | ICD-10-CM | POA: Diagnosis not present

## 2017-09-15 DIAGNOSIS — E7800 Pure hypercholesterolemia, unspecified: Secondary | ICD-10-CM

## 2017-09-15 DIAGNOSIS — I10 Essential (primary) hypertension: Secondary | ICD-10-CM

## 2017-09-15 DIAGNOSIS — R7309 Other abnormal glucose: Secondary | ICD-10-CM | POA: Insufficient documentation

## 2017-09-15 DIAGNOSIS — Z130 Encounter for screening for diseases of the blood and blood-forming organs and certain disorders involving the immune mechanism: Secondary | ICD-10-CM | POA: Diagnosis not present

## 2017-09-15 MED ORDER — CHLORTHALIDONE 25 MG PO TABS: 25.0000 mg | ORAL_TABLET | Freq: Every day | ORAL | 0 refills | Status: DC

## 2017-09-15 MED ORDER — AMLODIPINE BESYLATE 10 MG PO TABS
10.0000 mg | ORAL_TABLET | Freq: Every day | ORAL | 1 refills | Status: DC
Start: 2017-09-15 — End: 2018-07-23

## 2017-09-15 MED ORDER — CHLORTHALIDONE 25 MG PO TABS: 25.0000 mg | ORAL_TABLET | Freq: Every day | ORAL | 1 refills | Status: DC

## 2017-09-15 NOTE — Patient Instructions (Addendum)
  I will let you know the results of your labs - likely through a letter unless I am worried about something then I will give you a call.  I will see you in a month to make sure your BP is better.  Good luck with your surgery.  Good luck with your mom.   IF you received an x-ray today, you will receive an invoice from [facility]. Please contact [facility] at [phone] with questions or concerns regarding your invoice.   IF you received labwork today, you will receive an invoice from [facility]. Please contact [facility] at [phone] with questions or concerns regarding your invoice.   Our billing staff will not be able to assist you with questions regarding bills from these companies.  You will be contacted with the lab results as soon as they are available. The fastest way to get your results is to activate your My Chart account. Instructions are located on the last page of this paperwork. If you have not heard from us regarding the results in 2 weeks, please contact this office.

## 2017-09-15 NOTE — Progress Notes (Signed)
Nancy Osborne  MRN: [id number] DOB: 1961-04-05  PCP: Mancel Bale, PA-C  Chief Complaint  Patient presents with  . Hypertension    follow up     Subjective:  Pt presents to clinic for BP recheck.  She has been taking her medication but she has been under a lot of stress recently.  Is currently on FMLA due to helping her mother with a living situation where she had squatters living in her house. She feels like she is doing well with the stress.  She is planning on having liposuction this weekend but needs no labs prior to the procedure.  She is having it done in [place].    Home BP - 150s/70s   History is obtained by patient.  Review of Systems  Constitutional: Negative for chills and fever.  Eyes: Negative for visual disturbance.  Respiratory: Negative for shortness of breath.   Cardiovascular: Positive for leg swelling (at the end of the day). Negative for chest pain and palpitations.  Neurological: Negative for dizziness, light-headedness and headaches.    Patient Active Problem List   Diagnosis Date Noted  . Elevated hemoglobin A1c 09/15/2017  . Essential hypertension 10/21/2016  . Screening for diabetes mellitus 10/21/2016  . Morbid obesity ([place]) 02/02/2013    No current outpatient medications on file prior to visit.   No current facility-administered medications on file prior to visit.     No Known Allergies  No past medical history on file. Social History   Social History Narrative  . Not on file   Social History   Tobacco Use  . Smoking status: Never Smoker  . Smokeless tobacco: Never Used  Substance Use Topics  . Alcohol use: No  . Drug use: No   family history is not on file.     Objective:  BP (!) 152/90   Pulse 68   Temp 98.1 F (36.7 C) (Oral)   Resp 18   Ht '4\' 10"'  (1.473 m)   Wt 207 lb 6.4 oz (94.1 kg)   LMP 07/21/2011   SpO2 98%   BMI 43.35 kg/m  Body mass index is 43.35 kg/m.  Physical Exam  Constitutional: She is  oriented to person, place, and time and well-developed, well-nourished, and in no distress.  HENT:  Head: Normocephalic and atraumatic.  Right Ear: Hearing and external ear normal.  Left Ear: Hearing and external ear normal.  Eyes: Conjunctivae are normal.  Neck: Normal range of motion.  Cardiovascular: Normal rate, regular rhythm and normal heart sounds.  No murmur heard. Pulmonary/Chest: Effort normal and breath sounds normal.  Musculoskeletal:       Right lower leg: She exhibits no edema.       Left lower leg: She exhibits no edema.  Neurological: She is alert and oriented to person, place, and time. Gait normal.  Skin: Skin is warm and dry.  Psychiatric: Mood, memory, affect and judgment normal.  Vitals reviewed.   Assessment and Plan :  Elevated hemoglobin A1c - Plan: Hemoglobin A1c - recheck - she has been traveling want to make sure that nothing has gotten worse  Essential hypertension - Plan: CMP14+EGFR, amLODipine (NORVASC) 10 MG tablet, chlorthalidone (HYGROTON) 25 MG tablet, DISCONTINUED: chlorthalidone (HYGROTON) 25 MG tablet - pt is not completely controlled - will add additional medication that might also help with some LE edema in the evening - she will recheck with me to see if she has better control in 6 weeks  Elevated LDL cholesterol  level - Plan: Lipid panel - check labs - not currently on any medication  Screening for deficiency anemia - Plan: CBC with Differential/Platelet  Screen for colon cancer - Plan: Cologuard - does not want a colonscopy but did agree to this screening  Windell Hummingbird PA-C  Primary Care at [facility] 09/15/2017 4:25 PM

## 2017-09-16 LAB — CMP14+EGFR
ALBUMIN: 4.3 g/dL (ref 3.5–5.5)
ALK PHOS: 114 IU/L (ref 39–117)
ALT: 15 IU/L (ref 0–32)
AST: 20 IU/L (ref 0–40)
Albumin/Globulin Ratio: 1.4 (ref 1.2–2.2)
BUN / CREAT RATIO: 18 (ref 9–23)
BUN: 15 mg/dL (ref 6–24)
Bilirubin Total: 0.3 mg/dL (ref 0.0–1.2)
CO2: 26 mmol/L (ref 20–29)
CREATININE: 0.83 mg/dL (ref 0.57–1.00)
Calcium: 9.7 mg/dL (ref 8.7–10.2)
Chloride: 101 mmol/L (ref 96–106)
GFR calc non Af Amer: 79 mL/min/{1.73_m2} (ref >59)
GFR, EST AFRICAN AMERICAN: 91 mL/min/{1.73_m2} (ref >59)
GLUCOSE: 103 mg/dL — AB (ref 65–99)
Globulin, Total: 3.1 g/dL (ref 1.5–4.5)
Potassium: 4.4 mmol/L (ref 3.5–5.2)
SODIUM: 142 mmol/L (ref 134–144)
TOTAL PROTEIN: 7.4 g/dL (ref 6.0–8.5)

## 2017-09-16 LAB — HEMOGLOBIN A1C
Est. average glucose Bld gHb Est-mCnc: 131 mg/dL
HEMOGLOBIN A1C: 6.2 % — AB (ref 4.8–5.6)

## 2017-09-16 LAB — CBC WITH DIFFERENTIAL/PLATELET
BASOS ABS: 0 10*3/uL (ref 0.0–0.2)
Basos: 0 %
EOS (ABSOLUTE): 0 10*3/uL (ref 0.0–0.4)
Eos: 1 %
HEMOGLOBIN: 14 g/dL (ref 11.1–15.9)
Hematocrit: 42.8 % (ref 34.0–46.6)
Immature Grans (Abs): 0 10*3/uL (ref 0.0–0.1)
Immature Granulocytes: 0 %
LYMPHS ABS: 2 10*3/uL (ref 0.7–3.1)
Lymphs: 40 %
MCH: 26.9 pg (ref 26.6–33.0)
MCHC: 32.7 g/dL (ref 31.5–35.7)
MCV: 82 fL (ref 79–97)
MONOS ABS: 0.3 10*3/uL (ref 0.1–0.9)
Monocytes: 6 %
NEUTROS ABS: 2.7 10*3/uL (ref 1.4–7.0)
Neutrophils: 53 %
Platelets: 303 10*3/uL (ref 150–379)
RBC: 5.2 x10E6/uL (ref 3.77–5.28)
RDW: 16.2 % — ABNORMAL HIGH (ref 12.3–15.4)
WBC: 5.1 10*3/uL (ref 3.4–10.8)

## 2017-09-16 LAB — LIPID PANEL
CHOL/HDL RATIO: 2.8 ratio (ref 0.0–4.4)
Cholesterol, Total: 212 mg/dL — ABNORMAL HIGH (ref 100–199)
HDL: 75 mg/dL (ref >39)
LDL CALC: 127 mg/dL — AB (ref 0–99)
TRIGLYCERIDES: 51 mg/dL (ref 0–149)
VLDL CHOLESTEROL CAL: 10 mg/dL (ref 5–40)

## 2017-09-18 NOTE — Progress Notes (Signed)
Letter Sent.

## 2017-09-28 MED FILL — AMLODIPINE BESYLATE 10 MG T: 10 | 90 days supply | Qty: 90 | Fill #0

## 2017-10-16 LAB — COLOGUARD: COLOGUARD: NEGATIVE

## 2017-10-20 ENCOUNTER — Ambulatory Visit: Payer: No Typology Code available for payment source | Admitting: Physician Assistant

## 2017-12-21 ENCOUNTER — Telehealth: Payer: Self-pay | Admitting: Physician Assistant

## 2017-12-21 NOTE — Telephone Encounter (Signed)
Copied from CRM [phone]. Topic: Quick Communication - See Telephone Encounter >> Dec 21, 2017 12:55 PM Lorrine KinMcGee, Kaimani Clayson B, NT wrote: CRM for notification. See Telephone encounter for: 12/21/17. Patient calling and states that she was told that insurance would cover her cologuard testing by the Dr. Andrey CotaStates that she received a bill for the cologuard. Would like a call back to discuss. Please advise. CB#: [phone] or [phone] (husband)

## 2017-12-22 NOTE — Telephone Encounter (Signed)
Billing question ZO:[id number]:cologuard sent to [facility]

## 2018-04-12 ENCOUNTER — Ambulatory Visit: Payer: Self-pay | Admitting: *Deleted

## 2018-04-12 ENCOUNTER — Emergency Department (HOSPITAL_BASED_OUTPATIENT_CLINIC_OR_DEPARTMENT_OTHER): Payer: No Typology Code available for payment source

## 2018-04-12 ENCOUNTER — Encounter (HOSPITAL_BASED_OUTPATIENT_CLINIC_OR_DEPARTMENT_OTHER): Payer: Self-pay | Admitting: *Deleted

## 2018-04-12 ENCOUNTER — Other Ambulatory Visit: Payer: Self-pay

## 2018-04-12 ENCOUNTER — Emergency Department (HOSPITAL_BASED_OUTPATIENT_CLINIC_OR_DEPARTMENT_OTHER)
Admission: EM | Admit: 2018-04-12 | Discharge: 2018-04-12 | Disposition: A | Discharge status: Home / Self Care | Payer: No Typology Code available for payment source | Attending: Emergency Medicine | Admitting: Emergency Medicine

## 2018-04-12 DIAGNOSIS — I10 Essential (primary) hypertension: Secondary | ICD-10-CM | POA: Insufficient documentation

## 2018-04-12 DIAGNOSIS — Z79899 Other long term (current) drug therapy: Secondary | ICD-10-CM | POA: Insufficient documentation

## 2018-04-12 DIAGNOSIS — R0602 Shortness of breath: Secondary | ICD-10-CM | POA: Diagnosis present

## 2018-04-12 DIAGNOSIS — I359 Nonrheumatic aortic valve disorder, unspecified: Secondary | ICD-10-CM | POA: Insufficient documentation

## 2018-04-12 DIAGNOSIS — Q254 Congenital malformation of aorta unspecified: Secondary | ICD-10-CM

## 2018-04-12 HISTORY — DX: Essential (primary) hypertension: I10

## 2018-04-12 LAB — BASIC METABOLIC PANEL
ANION GAP: 11 (ref 5–15)
BUN: 11 mg/dL (ref 6–20)
CHLORIDE: 101 mmol/L (ref 98–111)
CO2: 28 mmol/L (ref 22–32)
Calcium: 9.4 mg/dL (ref 8.9–10.3)
Creatinine, Ser: 0.85 mg/dL (ref 0.44–1.00)
Glucose, Bld: 109 mg/dL — ABNORMAL HIGH (ref 70–99)
POTASSIUM: 3.5 mmol/L (ref 3.5–5.1)
SODIUM: 140 mmol/L (ref 135–145)

## 2018-04-12 LAB — CBC WITH DIFFERENTIAL/PLATELET
ABS IMMATURE GRANULOCYTES: 0.02 10*3/uL (ref 0.00–0.07)
BASOS PCT: 1 %
Basophils Absolute: 0 10*3/uL (ref 0.0–0.1)
EOS ABS: 0 10*3/uL (ref 0.0–0.5)
Eosinophils Relative: 0 %
HCT: 44.7 % (ref 36.0–46.0)
Hemoglobin: 13.8 g/dL (ref 12.0–15.0)
IMMATURE GRANULOCYTES: 0 %
Lymphocytes Relative: 19 %
Lymphs Abs: 1.3 10*3/uL (ref 0.7–4.0)
MCH: 26.4 pg (ref 26.0–34.0)
MCHC: 30.9 g/dL (ref 30.0–36.0)
MCV: 85.6 fL (ref 80.0–100.0)
Monocytes Absolute: 0.4 10*3/uL (ref 0.1–1.0)
Monocytes Relative: 6 %
NEUTROS ABS: 5.3 10*3/uL (ref 1.7–7.7)
NEUTROS PCT: 74 %
NRBC: 0 % (ref 0.0–0.2)
PLATELETS: 329 10*3/uL (ref 150–400)
RBC: 5.22 MIL/uL — AB (ref 3.87–5.11)
RDW: 15 % (ref 11.5–15.5)
WBC: 7.2 10*3/uL (ref 4.0–10.5)

## 2018-04-12 LAB — D-DIMER, QUANTITATIVE (NOT AT ARMC): D DIMER QUANT: 0.37 ug{FEU}/mL (ref 0.00–0.50)

## 2018-04-12 LAB — TROPONIN I

## 2018-04-12 MED ORDER — HYDROXYZINE HCL 25 MG PO TABS: 25.0000 mg | ORAL_TABLET | Freq: Four times a day (QID) | ORAL | 0 refills | Status: DC

## 2018-04-12 MED FILL — hydrOXYzine HCL 25 MG TABS: 25 | 3 days supply | Qty: 12 | Fill #0 | Status: TO

## 2018-04-12 NOTE — ED Notes (Signed)
Pt amulated while on SpO2 monitor on R/A. SpO2 maintained at 95% with HR of 83 during ambulation. Pt reported no episodes of ShOB during ambulation.

## 2018-04-12 NOTE — ED Provider Notes (Signed)
[facility] EMERGENCY DEPARTMENT Provider Note   CSN: [id number] Arrival date & time: 04/12/18  1104     History   Chief Complaint Chief Complaint  Patient presents with  . Shortness of Breath    HPI Nancy Osborne is a 57 y.o. female.  HPI  Patient is a 57 year old female with a history of hypertension, elevated hemoglobin A1c, and obesity presenting for shortness of breath.  Patient reports that this began around 6 AM yesterday morning after a sleepless night.  Patient reports that she was attending a family funeral for her sister-in-law with whom she was very close, and while at the hotel, she woke up to use the bathroom several times and felt that she could not get enough air in.  Patient reports that she would develop some chest tightness at the same time.  Patient reports that the pain was nonradiating.  Patient denies any pleuritic pain, syncope or presyncope, nausea, vomiting, diaphoresis, fever, chills, cough, hemoptysis.  Patient denies any personal history of CAD, or family history of early MI.  Patient is a never smoker.  Patient denies any recent long immobilizing trips, estrogen use, recent surgery, recent hospitalization, history of DVT/PE, cancer treatment, calf pain or swelling.   Patient did present to [facility] yesterday, and they performed abdominal x-ray which noted constipation, EKG, basic labs, and troponin.  Patient had negative work-up.  Past Medical History:  Diagnosis Date  . Hypertension     Patient Active Problem List   Diagnosis Date Noted  . Elevated hemoglobin A1c 09/15/2017  . Essential hypertension 10/21/2016  . Screening for diabetes mellitus 10/21/2016  . Morbid obesity ([facility]) 02/02/2013    History reviewed. No pertinent surgical history.   OB History   None      Home Medications    Prior to Admission medications   Medication Sig Start Date End Date Taking? Authorizing Provider  amLODipine (NORVASC) 10 MG  tablet Take 1 tablet (10 mg total) by mouth daily. 09/15/17  Yes Weber, Dema Severin, PA-C  chlorthalidone (HYGROTON) 25 MG tablet Take 1 tablet (25 mg total) by mouth daily. 09/15/17   Valarie Cones, Dema Severin, PA-C    Family History No family history on file.  Social History Social History   Tobacco Use  . Smoking status: Never Smoker  . Smokeless tobacco: Never Used  Substance Use Topics  . Alcohol use: No  . Drug use: No     Allergies   Patient has no known allergies.   Review of Systems Review of Systems  Constitutional: Negative for chills and fever.  HENT: Negative for congestion, rhinorrhea, sinus pain and sore throat.   Eyes: Negative for visual disturbance.  Respiratory: Positive for chest tightness and shortness of breath. Negative for cough.   Cardiovascular: Negative for chest pain, palpitations and leg swelling.  Gastrointestinal: Positive for constipation. Negative for abdominal pain, diarrhea, nausea and vomiting.  Genitourinary: Negative for dysuria and flank pain.  Musculoskeletal: Negative for back pain and myalgias.  Skin: Negative for rash.  Neurological: Negative for dizziness, syncope, light-headedness and headaches.     Physical Exam Updated Vital Signs BP (!) 151/86   Pulse 66   Temp 98.6 F (37 C) (Oral)   Resp 16   Ht 5' (1.524 m)   Wt 83.9 kg   LMP 07/21/2011   SpO2 100%   BMI 36.13 kg/m   Physical Exam  Constitutional: She appears well-developed and well-nourished. No distress.  HENT:  Head: Normocephalic  and atraumatic.  Mouth/Throat: Oropharynx is clear and moist.  Eyes: Pupils are equal, round, and reactive to light. Conjunctivae and EOM are normal.  Neck: Normal range of motion. Neck supple.  Cardiovascular: Normal rate, regular rhythm, S1 normal, S2 normal and intact distal pulses.  No murmur heard. Pulses:      Radial pulses are 2+ on the right side, and 2+ on the left side.       Dorsalis pedis pulses are 2+ on the right side, and 2+  on the left side.  Pulmonary/Chest: Effort normal and breath sounds normal. No tachypnea. No respiratory distress. She has no wheezes. She has no rales. Bony tenderness:    Patient speaks in full sentences without difficulty.  No tachypnea noted on my exam.  Abdominal: Soft. She exhibits no distension. There is no tenderness. There is no guarding.  Musculoskeletal: Normal range of motion. She exhibits no edema or deformity.  Lymphadenopathy:    She has no cervical adenopathy.  Neurological: She is alert.  Cranial nerves grossly intact. Patient moves extremities symmetrically and with good coordination.  Skin: Skin is warm and dry. No rash noted. No erythema.  Psychiatric: She has a normal mood and affect. Her behavior is normal. Judgment and thought content normal.  Nursing note and vitals reviewed.    ED Treatments / Results  Labs (all labs ordered are listed, but only abnormal results are displayed) Labs Reviewed  BASIC METABOLIC PANEL - Abnormal; Notable for the following components:      Result Value   Glucose, Bld 109 (*)    All other components within normal limits  CBC WITH DIFFERENTIAL/PLATELET - Abnormal; Notable for the following components:   RBC 5.22 (*)    All other components within normal limits  TROPONIN I  D-DIMER, QUANTITATIVE (NOT AT [facility])    EKG EKG Interpretation  Date/Time:  Monday April 12 2018 11:16:52 EDT Ventricular Rate:  79 PR Interval:  150 QRS Duration: 82 QT Interval:  368 QTC Calculation: 421 R Axis:   24 Text Interpretation:  Sinus rhythm with occasional Premature ventricular complexes Otherwise normal ECG No previous ECGs available Confirmed by Frederick Peers [phone]) on 04/12/2018 3:58:16 PM   Radiology Dg Chest 2 View  Result Date: 04/12/2018 CLINICAL DATA:  Left-sided chest pain and shortness of breath for 2 days. EXAM: CHEST - 2 VIEW COMPARISON:  None. FINDINGS: The heart size is within normal limits. Ectasia of thoracic aorta  noted. Both lungs are clear. The visualized skeletal structures are unremarkable. IMPRESSION: No active cardiopulmonary disease. Electronically Signed   By: Myles Rosenthal M.D.   On: 04/12/2018 14:49    Procedures Procedures (including critical care time)  Medications Ordered in ED Medications - No data to display   Initial Impression / Assessment and Plan / ED Course  I have reviewed the triage vital signs and the nursing notes.  Pertinent labs & imaging results that were available during my care of the patient were reviewed by me and considered in my medical decision making (see chart for details).    Patient is nontoxic-appearing, afebrile, and hemodynamically stable.  No hypoxia or tachypnea.  Patient did have elevated blood pressures in emergency department, with systolics in the 150s, however do not feel this is representative of hypertensive urgency.  Work-up is reassuring.  Doubt ACS, as patient had negative cardiac work-up per report at outside hospital yesterday, and a negative troponin today.  Normal EKG.  Heart score 2 (age, risk factors). Doubt pneumothorax,  as patient has normal lung exam, normal lung tissue on chest x-ray.  Patient did have report of aortic ectasia on chest x-ray, however patient has equal pulses, is nontoxic-appearing, and is not having active chest pain or active shortness of breath, therefore do not feel that patient has acute aortic syndrome.  Recommend outpatient follow-up.  Patient had negative d-dimer, therefore doubt pulmonary embolism.  Given patient's recent emotional distress, feel that patient's symptoms are most likely related to acute stress reaction given the negative cardiopulmonary work-up today.  Discussed this etiology with the patient, will prescribe Atarax as needed, and discussed with patient close follow-up with primary care and mental health resources.  Patient's husband plans to use employee assistance program to assist her grief recovery as  needed.  Return precautions given for any worsening chest pain, shortness of breath, syncope or presyncope.  Patient and husband are in understanding and agree with the plan of care.  This is a supervised visit with Dr. Frederick Peers. Evaluation, management, and discharge planning discussed with this attending physician.  Final Clinical Impressions(s) / ED Diagnoses   Final diagnoses:  SOB (shortness of breath)  Aortic anomaly  Elevated blood pressure reading with diagnosis of hypertension    ED Discharge Orders         Ordered    hydrOXYzine (ATARAX/VISTARIL) 25 MG tablet  Every 6 hours     04/12/18 1644           Delia Chimes 04/12/18 1647    Little, Ambrose Finland, MD 04/13/18 [phone]

## 2018-04-12 NOTE — ED Notes (Signed)
Pt states legs feel restless and stomach burning. Speech clear. Vss. Skin warm and dry.

## 2018-04-12 NOTE — ED Notes (Signed)
Pt states feels anxious

## 2018-04-12 NOTE — Discharge Instructions (Addendum)
Please see the information and instructions below regarding your visit.  Your diagnoses today include:  1. SOB (shortness of breath)   2. Aortic anomaly     Tests performed today include: See side panel of your discharge paperwork for testing performed today. Vital signs are listed at the bottom of these instructions.   Your work-up is very reassuring today.  There were no signs of heart attack or blood clots in the lungs.  Your chest x-ray did read that the large vessel coming out from your heart called the aorta appeared to be slightly more curved than normal, however this may be how your vessel has always been, we just do not have prior imaging.  I recommend follow-up with your primary care provider to see if they deem a CT scan of the chest necessary to pursue further.  Medications prescribed:    Take any prescribed medications only as prescribed, and any over the counter medications only as directed on the packaging.  You are prescribed Atarax, and antihistamine.  It is a cousin of Benadryl.  Please take 25 mg every 6 hours as needed for anxiety.  This can make you slightly sleepy similar to Benadryl, so I recommend no driving, drinking alcohol, operating machinery while taking.  Home care instructions:  Please follow any educational materials contained in this packet.   On testing today, your potassium level was 3.5 which is borderline normal. Increasing potassium in your diet should be sufficient to make sure you are getting enough potassium. Below is a list of good sources and servings high in potassium (in order from greatest to least):  -1 cup cooked acorn squash -1 baked potato with skin -1 cup cooked spinach -1 cup cooked lentils -1 cup cooked kidney beans -Watermelon - cup raisins -1 cup plain yogurt -1 cup orange juice, frozen -Banana -1 cup 1% low-fat milk -1 cup cooked broccoli   Follow-up instructions: Please follow-up with your primary care provider in 7-10  days for further evaluation of your symptoms if they are not completely improved.   Return instructions:  Please return to the Emergency Department if you experience worsening symptoms.  Please return to the emergency department if you develop any worsening shortness of breath, sensations and not getting enough air, chest pain or tightness, or passing out or near passing out spells. Please return if you have any other emergent concerns.  Additional Information:   Your vital signs today were: BP (!) 151/86    Pulse 66    Temp 98.6 F (37 C) (Oral)    Resp 16    Ht 5' (1.524 m)    Wt 83.9 kg    LMP 07/21/2011    SpO2 100%    BMI 36.13 kg/m  If your blood pressure (BP) was elevated on multiple readings during this visit above 130 for the top number or above 80 for the bottom number, please have this repeated by your primary care provider within one month. --------------  Thank you for allowing [place] to participate in your care today.

## 2018-04-12 NOTE — Telephone Encounter (Signed)
Called in c/o having chest tightness between her chest and throat and a "pressure like someone is sitting on me in my upper back".    "I feel hot and jittery".   "I'm moving back and forth now because I don't like the way I feel".   " I get short of breath".  My skin feels so hot right now like I have fever.  She went out of town to [place], [place] over the weekend for a funeral.   Burgess Estelle she ended up going to the hospital there for chest tightness and shortness of breath.   They did not find anything wrong with her heart but said she was constipated.   Told her to take Malox.  She did and has had a BM.  She started having this chest tightness this morning at 9:00 again.  See triage notes.  She used to see Benny Lennert at [facility] at [place] but has not seen another provider.   Benny Lennert is no longer at [place].  Has not established with another provider so note not routed to a provider.  I have referred her to the ED due to the chest and upper back tightness along with feeling jittery and short of breath.   Her husband is going to take her to [facility] ED now.    Reason for Disposition . [1] Chest pain lasts > 5 minutes AND [2] described as crushing, pressure-like, or heavy  Answer Assessment - Initial Assessment Questions 1. LOCATION: "Where does it hurt?"       My sister in law died this past weekend.   My chest feels tight and like I could not breath.   I went to the [facility] in [place], [place] yesterday.   they did an EKG and checked me for a heart attack and said they did not find anything.   I said I was constipated.   They prescribed Malox.  I had a BM.   I came on back home 2. RADIATION: "Does the pain go anywhere else?" (e.g., into neck, jaw, arms, back)     I'm having a mild chest tightness between my chest and my throat.   I don't feel it anywhere else.   Denies trouble swallowing.    When I'm talking I feel out of breath at times, not all the time.    3. ONSET: "When did the chest pain begin?" (Minutes, hours or days)      It started 9:00 this morning.  I'm feeling short of breath this morning.   Denies any illnesses.     I do take medication for my BP.   It was high at the hospital but then it came back down. 4. PATTERN "Does the pain come and go, or has it been constant since it started?"  "Does it get worse with exertion?"      Comes and goes.   I'm feeling a little better right now.   I feel like I have a fever.   My skin feels hot right now. 5. DURATION: "How long does it last" (e.g., seconds, minutes, hours)     Maybe seconds and then goes away. 6. SEVERITY: "How bad is the pain?"  (e.g., Scale 1-10; mild, moderate, or severe)    - MILD (1-3): doesn't interfere with normal activities     - MODERATE (4-7): interferes with normal activities or awakens from sleep    - SEVERE (8-10): excruciating pain, unable to do any  normal activities       I give it an 8.   7. CARDIAC RISK FACTORS: "Do you have any history of heart problems or risk factors for heart disease?" (e.g., prior heart attack, angina; high blood pressure, diabetes, being overweight, high cholesterol, smoking, or strong family history of heart disease)     I feel frightened right now like someone is scaring me.  Denies anxiety in past.   Denies cardiac history.  No diabetes.  No breathing problems listed above. 8. PULMONARY RISK FACTORS: "Do you have any history of lung disease?"  (e.g., blood clots in lung, asthma, emphysema, birth control pills)     Denies any blood clots any where. 9. CAUSE: "What do you think is causing the chest pain?"     I have no idea.   The chest tightness started on Thursday.  I took 2 aspirin on Thursday.   When we got to the funeral it really started at the funeral on Saturday bad enough to go to the hospital.   I'm restless now and moving back and forth.   I feel weak like someone has taken all my energy. 10. OTHER SYMPTOMS: "Do you have any other  symptoms?" (e.g., dizziness, nausea, vomiting, sweating, fever, difficulty breathing, cough)       My potassium was 3.4 nothing to worry about. I was at the hospital I felt like I could throw up and now I feel like I could throw up.   I've not eaten anything this morning just took my BP medication.    Before I called you I took 2 aspirin.      I just feel really weak.   No sweating. I heard her coughing dry cough.   The cough started yesterday.    Denies any other cold URI symptoms. I have a pressure in my right upper back like someone is sitting on it.  Pressure started a month ago because I thought it was arthritis.   I could Aleve and it would help but then it would come back.    The pain travels around.  I think it's arthritis.   My waist line on the right side has a pain there.   This morning I put Biofreeze on it.   It made the pain go away.   That was a new pain that started a couple of weeks ago.       11. PREGNANCY: "Is there any chance you are pregnant?" "When was your last menstrual period?"       Not asked due to age.  Protocols used: CHEST PAIN-A-AH

## 2018-04-12 NOTE — ED Triage Notes (Addendum)
SOB this am. Dry cough since yesterday. She feels like she might have a fever. Husband states she was seen for same in the ED in [place] yesterday and everything was normal except for constipation that showed on xray.

## 2018-04-13 ENCOUNTER — Encounter: Payer: Self-pay | Admitting: Family Medicine

## 2018-04-13 ENCOUNTER — Other Ambulatory Visit: Payer: Self-pay

## 2018-04-13 ENCOUNTER — Ambulatory Visit (INDEPENDENT_AMBULATORY_CARE_PROVIDER_SITE_OTHER): Payer: No Typology Code available for payment source | Admitting: Family Medicine

## 2018-04-13 VITALS — BP 131/83 | HR 68 | Temp 98.6°F | Resp 16 | Ht 60.0 in | Wt 209.6 lb

## 2018-04-13 DIAGNOSIS — I10 Essential (primary) hypertension: Secondary | ICD-10-CM

## 2018-04-13 DIAGNOSIS — F4322 Adjustment disorder with anxiety: Secondary | ICD-10-CM | POA: Diagnosis not present

## 2018-04-13 DIAGNOSIS — Z23 Encounter for immunization: Secondary | ICD-10-CM | POA: Diagnosis not present

## 2018-04-13 DIAGNOSIS — F4321 Adjustment disorder with depressed mood: Secondary | ICD-10-CM

## 2018-04-13 MED ORDER — ESCITALOPRAM OXALATE 10 MG PO TABS: 10.0000 mg | ORAL_TABLET | Freq: Every day | ORAL | 1 refills | Status: DC

## 2018-04-13 MED ORDER — BUSPIRONE HCL 5 MG PO TABS: 5.0000 mg | ORAL_TABLET | Freq: Three times a day (TID) | ORAL | 0 refills | Status: DC | PRN

## 2018-04-13 MED FILL — ESCITALOPRAM 10 MG TABLET: 10 | 30 days supply | Qty: 30 | Fill #0

## 2018-04-13 MED FILL — busPIRone HCL 5 MG TABS: 5 | 10 days supply | Qty: 30 | Fill #0

## 2018-04-13 NOTE — Progress Notes (Signed)
Chief Complaint  Patient presents with  . Hypertension    was seen at the ER 04/12/2018 for elavated bp, sob,    HPI  Patient reports that her sister-in-law died and was buried  She was admitted at an ER in [place], [place] and they did a full work up and heart attack was ruled out Once she came back home she went back to the ER 24 hours later  She states that she was not feeling any better after the hospital discharge She reports that her blood pressure was 167 systolic in the ER on Sunday.   She reports that she was not given a refill of the chlorthalidone She is only taken the amlodipine 10 mg.  She reports that the death of her sister-in-law triggered her stress.  She also started a new job at the [employer] in [place].  Her sister-in-law was very close to her and her death was unexpected.    BP Readings from Last 3 Encounters:  04/13/18 131/83  04/12/18 131/89  09/15/17 (!) 152/90    Depression screen PHQ 2/9 04/13/2018 03/17/2017 11/25/2016 10/21/2016 10/01/2016  Decreased Interest 0 0 0 0 0  Down, Depressed, Hopeless 0 0 0 0 0  PHQ - 2 Score 0 0 0 0 0   GAD 7 : Generalized Anxiety Score 04/13/2018  Nervous, Anxious, on Edge 3  Control/stop worrying 3  Worry too much - different things 1  Trouble relaxing 3  Restless 1  Easily annoyed or irritable 2  Afraid - awful might happen 3  Total GAD 7 Score 16  Anxiety Difficulty Not difficult at all      Past Medical History:  Diagnosis Date  . Hypertension     Current Outpatient Medications  Medication Sig Dispense Refill  . amLODipine (NORVASC) 10 MG tablet Take 1 tablet (10 mg total) by mouth daily. 90 tablet 1  . hydrOXYzine (ATARAX/VISTARIL) 25 MG tablet Take 1 tablet (25 mg total) by mouth every 6 (six) hours. 12 tablet 0  . busPIRone (BUSPAR) 5 MG tablet Take 1 tablet (5 mg total) by mouth 3 (three) times daily as needed. 30 tablet 0  . escitalopram (LEXAPRO) 10 MG tablet Take 1 tablet (10 mg total) by mouth daily.  30 tablet 1   No current facility-administered medications for this visit.     Allergies: No Known Allergies  No past surgical history on file.  Social History   Socioeconomic History  . Marital status: Married    Spouse name: Not on file  . Number of children: Not on file  . Years of education: Not on file  . Highest education level: Not on file  Occupational History  . Not on file  Social Needs  . Financial resource strain: Not on file  . Food insecurity:    Worry: Not on file    Inability: Not on file  . Transportation needs:    Medical: Not on file    Non-medical: Not on file  Tobacco Use  . Smoking status: Never Smoker  . Smokeless tobacco: Never Used  Substance and Sexual Activity  . Alcohol use: No  . Drug use: No  . Sexual activity: Not on file  Lifestyle  . Physical activity:    Days per week: Not on file    Minutes per session: Not on file  . Stress: Not on file  Relationships  . Social connections:    Talks on phone: Not on file    Gets together:  Not on file    Attends religious service: Not on file    Active member of club or organization: Not on file    Attends meetings of clubs or organizations: Not on file    Relationship status: Not on file  Other Topics Concern  . Not on file  Social History Narrative  . Not on file    No family history on file.   ROS Review of Systems See HPI Constitution: No fevers or chills No malaise No diaphoresis Skin: No rash or itching Eyes: no blurry vision, no double vision GU: no dysuria or hematuria Neuro: no dizziness or headaches all others reviewed and negative   Objective: Vitals:   04/13/18 1208  BP: 131/83  Pulse: 68  Resp: 16  Temp: 98.6 F (37 C)  TempSrc: Oral  SpO2: 95%  Weight: 209 lb 9.6 oz (95.1 kg)  Height: 5' (1.524 m)    Physical Exam  Physical Exam  Constitutional: She is oriented to person, place, and time. She appears well-developed and well-nourished.  HENT:  Head:  Normocephalic and atraumatic.  Eyes: Conjunctivae and EOM are normal.  Cardiovascular: Normal rate, regular rhythm and normal heart sounds.   Pulmonary/Chest: Effort normal and breath sounds normal. No respiratory distress. She has no wheezes.  Abdominal: Normal appearance and bowel sounds are normal. There is no tenderness. There is no CVA tenderness.  Neurological: She is alert and oriented to person, place, and time.     Component     Latest Ref Rng & Units 04/12/2018  WBC     4.0 - 10.5 K/uL 7.2  RBC     3.87 - 5.11 MIL/uL 5.22 (H)  Hemoglobin     12.0 - 15.0 g/dL 16.1  HCT     09.6 - 04.5 % 44.7  MCV     80.0 - 100.0 fL 85.6  MCH     26.0 - 34.0 pg 26.4  MCHC     30.0 - 36.0 g/dL 40.9  RDW     81.1 - 91.4 % 15.0  Platelets     150 - 400 K/uL 329  nRBC     0.0 - 0.2 % 0.0  Neutrophils     % 74  NEUT#     1.7 - 7.7 K/uL 5.3  Lymphocytes     % 19  Lymphocyte #     0.7 - 4.0 K/uL 1.3  Monocytes Relative     % 6  Monocyte #     0.1 - 1.0 K/uL 0.4  Eosinophil     % 0  Eosinophils Absolute     0.0 - 0.5 K/uL 0.0  Basophil     % 1  Basophils Absolute     0.0 - 0.1 K/uL 0.0  Immature Granulocytes     % 0  Abs Immature Granulocytes     0.00 - 0.07 K/uL 0.02  Sodium     135 - 145 mmol/L 140  Potassium     3.5 - 5.1 mmol/L 3.5  Chloride     98 - 111 mmol/L 101  CO2     22 - 32 mmol/L 28  Glucose     70 - 99 mg/dL 782 (H)  BUN     6 - 20 mg/dL 11  Creatinine     9.56 - 1.00 mg/dL 2.13  Calcium     8.9 - 10.3 mg/dL 9.4  GFR, Est Non African American     >60 mL/min >60  GFR, Est African American     >60 mL/min >60  Anion gap     5 - 15 11  Troponin I     <0.03 ng/mL <0.03  D-Dimer, Quant     0.00 - 0.50 ug/mL-FEU 0.37     Assessment and Plan Serria was seen today for hypertension.  Diagnoses and all orders for this visit:  Adjustment disorder with anxious mood -  Patient interested in medication as prayer is not enough and she is feeling  overwhelmed Essential hypertension- bp in good range  Grieving- offered referral to [facility] but pt has good family support  Other orders -     escitalopram (LEXAPRO) 10 MG tablet; Take 1 tablet (10 mg total) by mouth daily. -     busPIRone (BUSPAR) 5 MG tablet; Take 1 tablet (5 mg total) by mouth 3 (three) times daily as needed.     Faizon Capozzi A Jenaye Rickert

## 2018-04-13 NOTE — Patient Instructions (Addendum)
To Do List: 1. Continue amlodipine once daily     Check your blood pressure at the local pharmacy and compared it to your blood pressure monitor  2. Start focusing on breathing during anxious times  3. Take escitalopram in the morning with food. It can cause stomach cramps.  4. Add on buspirone as needed for anxiety and panic attacks up to 3 times a day  5. Check you the [employer] or local church for grief counseling   6. Follow up in 6 weeks    If you have lab work done today you will be contacted with your lab results within the next 2 weeks.  If you have not heard from [place] then please contact us. The fastest way to get your results is to register for My Chart.   IF you received an x-ray today, you will receive an invoice from [facility]. Please contact [facility] at [phone] with questions or concerns regarding your invoice.   IF you received labwork today, you will receive an invoice from [facility]. Please contact [facility] at [phone] with questions or concerns regarding your invoice.   Our billing staff will not be able to assist you with questions regarding bills from these companies.  You will be contacted with the lab results as soon as they are available. The fastest way to get your results is to activate your My Chart account. Instructions are located on the last page of this paperwork. If you have not heard from [place] regarding the results in 2 weeks, please contact this office.      Complicated Grieving Grief is a normal response to the death of someone close to you. Feelings of fear, anger, and guilt can affect almost everyone who loses a loved one. It is also common to have symptoms of depression while you are grieving. These include problems with sleep, loss of appetite, and lack of energy. They may last for weeks or months after a loss. Complicated grief is different from normal grief or depression. Normal grieving involves sadness and feelings  of loss, but these feelings are not constant. Complicated grief is a constant and severe type of grief. It interferes with your ability to function normally. It may last for several months to a year or longer. Complicated grief may require treatment from a mental health care provider. What are the causes? It is not known why some people continue to struggle with grief and others do not. You may be at higher risk for complicated grief if:  The death of your loved one was sudden or unexpected.  The death of your loved one was due to a violent event.  Your loved one committed suicide.  Your loved one was a child or a young person.  You were very close to or dependent on the loved one.  You have a history of depression.  What are the signs or symptoms? Signs and symptoms of complicated grief may include:  Feeling disbelief or numbness.  Being unable to enjoy good memories of your loved one.  Needing to avoid anything that reminds you of your loved one.  Being unable to stop thinking about the death.  Feeling intense anger or guilt.  Feeling alone and hopeless.  Feeling that your life is meaningless and empty.  Losing the desire to live.  How is this diagnosed? Your health care provider may diagnose complicated grief if:  You have constant symptoms of grief for 6-12 months or longer.  Your symptoms are  interfering with your ability to live your life.  Your health care provider may want you to see a mental health care provider. Many symptoms of depression are similar to the symptoms of complicated grief. It is important to be evaluated for complicated grief along with other mental health conditions. How is this treated? Talk therapy with a mental health provider is the most common treatment for complicated grief. During therapy, you will learn healthy ways to cope with the loss of your loved one. In some cases, your mental health care provider may also recommend antidepressant  medicines. Follow these instructions at home:  Take care of yourself. ? Eat regular meals and maintain a healthy diet. Eat plenty of fruits, vegetables, and whole grains. ? Try to get some exercise each day. ? Keep regular hours for sleep. Try to get at least 8 hours of sleep each night.  Do not use drugs or alcohol to ease your symptoms.  Take medicines only as directed by your health care provider.  Spend time with friends and loved ones.  Consider joining a grief (bereavement) support group to help you deal with your loss.  Keep all follow-up visits as directed by your health care provider. This is important. Contact a health care provider if:  Your symptoms keep you from functioning normally.  Your symptoms do not get better with treatment. Get help right away if:  You have serious thoughts of hurting yourself or someone else.  You have suicidal feelings. This information is not intended to replace advice given to you by your health care provider. Make sure you discuss any questions you have with your health care provider. Document Released: 06/09/2005 Document Revised: 11/15/2015 Document Reviewed: 11/17/2013 Elsevier Interactive Patient Education  [employer].

## 2018-04-19 ENCOUNTER — Telehealth: Payer: Self-pay | Admitting: Family Medicine

## 2018-04-19 NOTE — Telephone Encounter (Unsigned)
Copied from CRM #[id number]. Topic: General - Other >> Apr 19, 2018 10:43 AM Maye Hides wrote: Reason for CRM: Pt was in office on 04/13/18 and would like a written note to return back to work

## 2018-04-19 NOTE — Telephone Encounter (Signed)
Informed pt letter will be ready for pick-up at [address].

## 2018-04-29 ENCOUNTER — Ambulatory Visit: Payer: Self-pay

## 2018-04-29 NOTE — Telephone Encounter (Signed)
Pt c/o difficulty swallowing her medications or eating. Pt stated that it feels like it gets stuck in her throat. Pt stated that she begins coughing and will try and drink water to get the pills or food down. Pt stated the symptoms began 3 weeks ago.Pt c/o sore throat due to the coughing. Pt stated that she is not eating as much as she normally would, occasionally feels nauseated and feels weak from not eating.  Pt stated that after she takes her medicine or eats, it feels like it takes about 5 seconds to go down and then she begins coughing.  Pt denies difficulty breathing, swollen tongue or chest pain.  Care advice given to pt and pt verbalized understanding. No available appointments with her physician of request (Dr. Creta Levin). Appointment given for  04/30/18 at 10:40 with Benjiman Core PA-C. Reason for Disposition . [1] Symptoms of pill stuck in throat or esophagus (e.g., pain in throat or chest, FB sensation) AND [2] no relief after using CARE ADVICE    With pills and food feels like it gets stuck in her esophagus.  Answer Assessment - Initial Assessment Questions 1. SYMPTOM: "Are you having difficulty swallowing liquids, solids, or both?"     When she swallows feels like 5 seconds that it does not go down and it is just sitting there 2. ONSET: "When did the swallowing problems begin?"       3 weeks ago 3. CAUSE: "What do you think is causing the problem?"      Pt doesn't know - started after taking medications 4. CHRONIC/RECURRENT: "Is this a new problem for you?"  If no, ask: "How long have you had this problem?" (e.g., days, weeks, months)      No- 3 weeks 5. OTHER SYMPTOMS: "Do you have any other symptoms?" (e.g., difficulty breathing, sore throat, swollen tongue, chest pain)   Sore throat 6. PREGNANCY: "Is there any chance you are pregnant?" "When was your last menstrual period?"     n/a  Protocols used: SWALLOWING DIFFICULTY-A-AH

## 2018-04-30 ENCOUNTER — Ambulatory Visit (INDEPENDENT_AMBULATORY_CARE_PROVIDER_SITE_OTHER): Payer: No Typology Code available for payment source | Admitting: Physician Assistant

## 2018-04-30 ENCOUNTER — Other Ambulatory Visit: Payer: Self-pay

## 2018-04-30 ENCOUNTER — Encounter: Payer: Self-pay | Admitting: Physician Assistant

## 2018-04-30 VITALS — BP 131/84 | HR 71 | Temp 98.9°F | Resp 18 | Ht 60.0 in | Wt 206.8 lb

## 2018-04-30 DIAGNOSIS — K219 Gastro-esophageal reflux disease without esophagitis: Secondary | ICD-10-CM

## 2018-04-30 DIAGNOSIS — M62838 Other muscle spasm: Secondary | ICD-10-CM

## 2018-04-30 DIAGNOSIS — R1310 Dysphagia, unspecified: Secondary | ICD-10-CM

## 2018-04-30 DIAGNOSIS — F4323 Adjustment disorder with mixed anxiety and depressed mood: Secondary | ICD-10-CM | POA: Diagnosis not present

## 2018-04-30 MED ORDER — HYDROXYZINE HCL 25 MG PO TABS: 25.0000 mg | ORAL_TABLET | Freq: Four times a day (QID) | ORAL | 0 refills | Status: DC

## 2018-04-30 MED ORDER — OMEPRAZOLE 20 MG PO CPDR: 20.0000 mg | DELAYED_RELEASE_CAPSULE | Freq: Every day | ORAL | 3 refills | Status: DC

## 2018-04-30 MED ORDER — BACLOFEN 10 MG PO TABS: 10.0000 mg | ORAL_TABLET | Freq: Three times a day (TID) | ORAL | 0 refills | Status: DC

## 2018-04-30 MED ORDER — HYDROXYZINE HCL 25 MG PO TABS: 12.5000 mg | ORAL_TABLET | Freq: Three times a day (TID) | ORAL | 0 refills | Status: DC | PRN

## 2018-04-30 MED ORDER — SERTRALINE HCL 25 MG PO TABS: ORAL_TABLET | ORAL | 0 refills | Status: DC

## 2018-04-30 MED FILL — OMEPRAZOLE 20 MG CPDR: 20 | 30 days supply | Qty: 30 | Fill #0

## 2018-04-30 MED FILL — hydrOXYzine HCL 25 MG TABS: 25 | 20 days supply | Qty: 60 | Fill #0

## 2018-04-30 MED FILL — SERTRALINE HCL 25 MG TABLET: 25 | 90 days supply | Qty: 173 | Fill #0

## 2018-04-30 MED FILL — BACLOFEN 10 MG TABLET: 10 | 10 days supply | Qty: 30 | Fill #0

## 2018-04-30 NOTE — Patient Instructions (Addendum)
For difficulty swallowing and anxiety, stop BuSpar and Lexapro.  Start Zoloft at 25 mg daily.  May increase to 50 mg daily after 1 week if tolerating well.  May use hydroxyzine every 6 hours as needed for worsening anxiety.  I would also like you to start omeprazole for acid reflux sensation and difficulty swallowing.  You can take this daily.  If still having symptoms after 2 weeks, please follow-up with GI for further evaluation.  If any of your symptoms worsen or you develop new concerning symptoms, please seek care sooner.  For back pain, I recommend daily heat, stretching, massage therapy and muscle relaxant as needed.  Muscle relaxants can make you drowsy so use cautiously.   FLEXION RANGE OF MOTION AND STRETCHING EXERCISES: STRETCH - Flexion, Single Knee to Chest   Lie on a firm bed or floor with both legs extended in front of you.  Keeping one leg in contact with the floor, bring your opposite knee to your chest. Hold your leg in place by either grabbing behind your thigh or at your knee.  Pull until you feel a gentle stretch in your lower back.   Slowly release your grasp and repeat the exercise with the opposite side.  STRETCH - Flexion, Double Knee to Chest   Lie on a firm bed or floor with both legs extended in front of you.  Keeping one leg in contact with the floor, bring your opposite knee to your chest.  Tense your stomach muscles to support your back and then lift your other knee to your chest. Hold your legs in place by either grabbing behind your thighs or at your knees.  Pull both knees toward your chest until you feel a gentle stretch in your lower back.   Tense your stomach muscles and slowly return one leg at a time to the floor.  STRETCH - Low Trunk Rotation  Lie on a firm bed or floor. Keeping your legs in front of you, bend your knees so they are both pointed toward the ceiling and your feet are flat on the floor.  Extend your arms out to the side. This  will stabilize your upper body by keeping your shoulders in contact with the floor.  Gently and slowly drop both knees together to one side until you feel a gentle stretch in your lower back.   Tense your stomach muscles to support your lower back as you bring your knees back to the starting position. Repeat the exercise to the other side.   EXTENSION RANGE OF MOTION AND FLEXIBILITY EXERCISES: STRETCH - Extension, Prone on Elbows   Lie on your stomach on the floor, a bed will be too soft. Place your palms about shoulder width apart and at the height of your head.  Place your elbows under your shoulders. If this is too painful, stack pillows under your chest.  Allow your body to relax so that your hips drop lower and make contact more completely with the floor.  Slowly return to lying flat on the floor.  RANGE OF MOTION - Extension, Prone Press Ups  Lie on your stomach on the floor, a bed will be too soft. Place your palms about shoulder width apart and at the height of your head.  Keeping your back as relaxed as possible, slowly straighten your elbows while keeping your hips on the floor. You may adjust the placement of your hands to maximize your comfort. As you gain motion, your hands will come more underneath  your shoulders.  Slowly return to lying flat on the floor.  RANGE OF MOTION- Quadruped, Neutral Spine   Assume a hands and knees position on a firm surface. Keep your hands under your shoulders and your knees under your hips. You may place padding under your knees for comfort.  Drop your head and point your tail bone toward the ground below you. This will round out your lower back like an angry cat.    Slowly lift your head and release your tail bone so that your back sags into a large arch, like an old horse.  Repeat this until you feel limber in your lower back.  Now, find your "sweet spot." This will be the most comfortable position somewhere between the two previous  positions. This is your neutral spine. Once you have found this position, tense your stomach muscles to support your lower back.  STRENGTHENING EXERCISES - Low Back Strain These exercises may help you when beginning to rehabilitate your injury. These exercises should be done near your "sweet spot." This is the neutral, low-back arch, somewhere between fully rounded and fully arched, that is your least painful position. When performed in this safe range of motion, these exercises can be used for people who have either a flexion or extension based injury. These exercises may resolve your symptoms with or without further involvement from your physician, physical therapist or athletic trainer. While completing these exercises, remember:   Muscles can gain both the endurance and the strength needed for everyday activities through controlled exercises.  Complete these exercises as instructed by your physician, physical therapist or athletic trainer. Increase the resistance and repetitions only as guided.  You may experience muscle soreness or fatigue, but the pain or discomfort you are trying to eliminate should never worsen during these exercises. If this pain does worsen, stop and make certain you are following the directions exactly. If the pain is still present after adjustments, discontinue the exercise until you can discuss the trouble with your caregiver.  STRENGTHENING - Deep Abdominals, Pelvic Tilt  Lie on a firm bed or floor. Keeping your legs in front of you, bend your knees so they are both pointed toward the ceiling and your feet are flat on the floor.  Tense your lower abdominal muscles to press your lower back into the floor. This motion will rotate your pelvis so that your tail bone is scooping upwards rather than pointing at your feet or into the floor.  STRENGTHENING - Abdominals, Crunches   Lie on a firm bed or floor. Keeping your legs in front of you, bend your knees so they are both  pointed toward the ceiling and your feet are flat on the floor. Cross your arms over your chest.  Slightly tip your chin down without bending your neck.  Tense your abdominals and slowly lift your trunk high enough to just clear your shoulder blades. Lifting higher can put excessive stress on the lower back and does not further strengthen your abdominal muscles.  Control your return to the starting position.  STRENGTHENING - Quadruped, Opposite UE/LE Lift   Assume a hands and knees position on a firm surface. Keep your hands under your shoulders and your knees under your hips. You may place padding under your knees for comfort.  Find your neutral spine and gently tense your abdominal muscles so that you can maintain this position. Your shoulders and hips should form a rectangle that is parallel with the floor and is not twisted.  Keeping your trunk steady, lift your right hand no higher than your shoulder and then your left leg no higher than your hip. Make sure you are not holding your breath.   Continuing to keep your abdominal muscles tense and your back steady, slowly return to your starting position. Repeat with the opposite arm and leg.  STRENGTHENING - Lower Abdominals, Double Knee Lift  Lie on a firm bed or floor. Keeping your legs in front of you, bend your knees so they are both pointed toward the ceiling and your feet are flat on the floor.  Tense your abdominal muscles to brace your lower back and slowly lift both of your knees until they come over your hips. Be certain not to hold your breath.  POSTURE AND BODY MECHANICS CONSIDERATIONS - Low Back Strain Keeping correct posture when sitting, standing or completing your activities will reduce the stress put on different body tissues, allowing injured tissues a chance to heal and limiting painful experiences. The following are general guidelines for improved posture. Your physician or physical therapist will provide you with any  instructions specific to your needs. While reading these guidelines, remember:  The exercises prescribed by your provider will help you have the flexibility and strength to maintain correct postures.  The correct posture provides the best environment for your joints to work. All of your joints have less wear and tear when properly supported by a spine with good posture. This means you will experience a healthier, less painful body.  Correct posture must be practiced with all of your activities, especially prolonged sitting and standing. Correct posture is as important when doing repetitive low-stress activities (typing) as it is when doing a single heavy-load activity (lifting). RESTING POSITIONS Consider which positions are most painful for you when choosing a resting position. If you have pain with flexion-based activities (sitting, bending, stooping, squatting), choose a position that allows you to rest in a less flexed posture. You would want to avoid curling into a fetal position on your side. If your pain worsens with extension-based activities (prolonged standing, working overhead), avoid resting in an extended position such as sleeping on your stomach. Most people will find more comfort when they rest with their spine in a more neutral position, neither too rounded nor too arched. Lying on a non-sagging bed on your side with a pillow between your knees, or on your back with a pillow under your knees will often provide some relief. Keep in mind, being in any one position for a prolonged period of time, no matter how correct your posture, can still lead to stiffness. PROPER SITTING POSTURE In order to minimize stress and discomfort on your spine, you must sit with correct posture. Sitting with good posture should be effortless for a healthy body. Returning to good posture is a gradual process. Many people can work toward this most comfortably by using various supports until they have the flexibility  and strength to maintain this posture on their own. When sitting with proper posture, your ears will fall over your shoulders and your shoulders will fall over your hips. You should use the back of the chair to support your upper back. Your lower back will be in a neutral position, just slightly arched. You may place a small pillow or folded towel at the base of your lower back for support.  When working at a desk, create an environment that supports good, upright posture. Without extra support, muscles tire, which leads to excessive strain on  joints and other tissues. Keep these recommendations in mind: CHAIR:  A chair should be able to slide under your desk when your back makes contact with the back of the chair. This allows you to work closely.  The chair's height should allow your eyes to be level with the upper part of your monitor and your hands to be slightly lower than your elbows. BODY POSITION  Your feet should make contact with the floor. If this is not possible, use a foot rest.  Keep your ears over your shoulders. This will reduce stress on your neck and lower back. INCORRECT SITTING POSTURES  If you are feeling tired and unable to assume a healthy sitting posture, do not slouch or slump. This puts excessive strain on your back tissues, causing more damage and pain. Healthier options include:  Using more support, like a lumbar pillow.  Switching tasks to something that requires you to be upright or walking.  Talking a brief walk.  Lying down to rest in a neutral-spine position. PROLONGED STANDING WHILE SLIGHTLY LEANING FORWARD  When completing a task that requires you to lean forward while standing in one place for a long time, place either foot up on a stationary 2-4 inch high object to help maintain the best posture. When both feet are on the ground, the lower back tends to lose its slight inward curve. If this curve flattens (or becomes too large), then the back and your other  joints will experience too much stress, tire more quickly, and can cause pain. CORRECT STANDING POSTURES Proper standing posture should be assumed with all daily activities, even if they only take a few moments, like when brushing your teeth. As in sitting, your ears should fall over your shoulders and your shoulders should fall over your hips. You should keep a slight tension in your abdominal muscles to brace your spine. Your tailbone should point down to the ground, not behind your body, resulting in an over-extended swayback posture.  INCORRECT STANDING POSTURES  Common incorrect standing postures include a forward head, locked knees and/or an excessive swayback. WALKING Walk with an upright posture. Your ears, shoulders and hips should all line-up. PROLONGED ACTIVITY IN A FLEXED POSITION When completing a task that requires you to bend forward at your waist or lean over a low surface, try to find a way to stabilize 3 out of 4 of your limbs. You can place a hand or elbow on your thigh or rest a knee on the surface you are reaching across. This will provide you more stability so that your muscles do not fatigue as quickly. By keeping your knees relaxed, or slightly bent, you will also reduce stress across your lower back. CORRECT LIFTING TECHNIQUES DO :   Assume a wide stance. This will provide you more stability and the opportunity to get as close as possible to the object which you are lifting.  Tense your abdominals to brace your spine. Bend at the knees and hips. Keeping your back locked in a neutral-spine position, lift using your leg muscles. Lift with your legs, keeping your back straight.  Test the weight of unknown objects before attempting to lift them.  Try to keep your elbows locked down at your sides in order get the best strength from your shoulders when carrying an object.  Always ask for help when lifting heavy or awkward objects. INCORRECT LIFTING TECHNIQUES DO NOT:   Lock  your knees when lifting, even if it is a small object.  Bend and twist. Pivot at your feet or move your feet when needing to change directions.  Assume that you can safely pick up even a paper clip without proper posture.     IF you received an x-ray today, you will receive an invoice from [facility]. Please contact [facility] at [phone] with questions or concerns regarding your invoice.   IF you received labwork today, you will receive an invoice from [facility]. Please contact [facility] at [phone] with questions or concerns regarding your invoice.   Our billing staff will not be able to assist you with questions regarding bills from these companies.  You will be contacted with the lab results as soon as they are available. The fastest way to get your results is to activate your My Chart account. Instructions are located on the last page of this paperwork. If you have not heard from [place] regarding the results in 2 weeks, please contact this office.

## 2018-04-30 NOTE — Progress Notes (Signed)
Nancy Osborne  MRN: [id number] DOB: [birth year]  Subjective:  Nancy Osborne is a 57 y.o. female seen in office today for a chief complaint of multiple complaints.  -Saw Dr. Creta Levin on 04/13/2018 and was started on Lexapro and BuSpar.  Was taking as prescribed but noticed she was having nausea, GI upset, dizziness, dysphagia, and worsening anxiety.  She took medications for about 2 weeks and then discontinued due to sx. Nausea, upset, and dizziness have improved since stopping the medication.  She is still having dysphagia sensation.  Her anxiety still feels uncontrolled.  Has some dysphoric mood, decreased interest in doing things, and racing thoughts.  Started after a death in the family and has been very overwhelming for her.  Has taken hydroxyzine which was prescribed in the ER and this is helped.  She is almost out.  Denies SI and HI.  -dysphagia: Started after starting Lexapro and BuSpar.  Has continued since discontinuing the medication a couple weeks ago.  Has a globus sensation mostly with food, sometimes water.  Has associated heartburn belching.  Denies regurgitation of food, vomiting,hematemesis weight loss, abdominal pain, abdominal bloating, melena, hematochezia, fever, chills, diaphoresis, and night sweats.  No recent antibiotic use or over-the-counter medication use.  Avoid spicy foods.  Chews food well before swallowing.  Has not tried any medication for relief.  Had Cologuard in 2019, normal.   -upper back pain: Has had issues with this for the past year.  Feels like muscle tightness and spasm in the right shoulder.  Tends to worsen as the day progresses.  Will use topical BenGay and have her daughter massage the area, which really helps.  Has not tried stretching or heating pad.  Denies numbness, tingling, decreased range of motion.  Denies acute injury.  No other questions or concerns. Review of Systems  Per HPI  Patient Active Problem List   Diagnosis Date Noted  . Elevated  hemoglobin A1c 09/15/2017  . Essential hypertension 10/21/2016  . Screening for diabetes mellitus 10/21/2016  . Morbid obesity (HCC) 02/02/2013    Current Outpatient Medications on File Prior to Visit  Medication Sig Dispense Refill  . amLODipine (NORVASC) 10 MG tablet Take 1 tablet (10 mg total) by mouth daily. 90 tablet 1  . busPIRone (BUSPAR) 5 MG tablet Take 1 tablet (5 mg total) by mouth 3 (three) times daily as needed. 30 tablet 0  . escitalopram (LEXAPRO) 10 MG tablet Take 1 tablet (10 mg total) by mouth daily. 30 tablet 1  . hydrOXYzine (ATARAX/VISTARIL) 25 MG tablet Take 1 tablet (25 mg total) by mouth every 6 (six) hours. 12 tablet 0   No current facility-administered medications on file prior to visit.     No Known Allergies   Objective:  BP 131/84   Pulse 71   Temp 98.9 F (37.2 C) (Oral)   Resp 18   Ht 5' (1.524 m)   Wt 206 lb 12.8 oz (93.8 kg)   LMP 07/21/2011   SpO2 95%   BMI 40.39 kg/m    Physical Exam  Constitutional: She is oriented to person, place, and time. She appears well-developed and well-nourished. No distress.  HENT:  Head: Normocephalic and atraumatic.  Mouth/Throat: Uvula is midline, oropharynx is clear and moist and mucous membranes are normal. No trismus in the jaw. No uvula swelling. No posterior oropharyngeal edema.  Eyes: Conjunctivae are normal.  Neck: Normal range of motion.  Cardiovascular:  Pulses:      Radial pulses are  2+ on the right side, and 2+ on the left side.  Pulmonary/Chest: Effort normal and breath sounds normal. She has no decreased breath sounds. She has no wheezes. She has no rhonchi. She has no rales.  Abdominal: Soft. Normal appearance and bowel sounds are normal. There is no tenderness.  Musculoskeletal:       Cervical back: She exhibits tenderness (+TTP with palpation of right side trapezius musculature) and spasm. She exhibits normal range of motion and no bony tenderness.       Thoracic back: Normal.       Lumbar  back: She exhibits normal range of motion, no tenderness, no bony tenderness and no spasm.  Neurological: She is alert and oriented to person, place, and time. Gait normal.  Reflex Scores:      Patellar reflexes are 2+ on the right side and 2+ on the left side.      Achilles reflexes are 2+ on the right side and 2+ on the left side. Sensation of BUE intact. Strength of BUE 5/5.  Skin: Skin is warm and dry.  Psychiatric: She has a normal mood and affect.  Vitals reviewed.   Assessment and Plan :  1. Dysphagia, unspecified type ADR of dysphagia has been reported with use of Lexapro.  Patient recently discontinued medication.  Would expect this to improve once medication is out of system.  She also endorses signs of GERD, which have been present before starting medication.  Recommend daily PPI for the next 2 to 4 weeks.  No alarm features at this time.  No acute findings on physical exam.  If symptoms persist or start to progress, recommend evaluation by GI for possible endoscopy. - omeprazole (PRILOSEC) 20 MG capsule; Take 1 capsule (20 mg total) by mouth daily.  Dispense: 30 capsule; Refill: 3 - Ambulatory referral to Gastroenterology  2. Gastroesophageal reflux disease, esophagitis presence not specified  3. Adjustment disorder with mixed anxiety and depressed mood Patient still endorses both anxiety and depression symptoms.  Hydroxyzine helps with moments of increased panic, will provide refill today.  Recommend discontinuing both BuSpar and Lexapro due to adverse reactions and starting Zoloft.  Recommend starting with a low dose and increasing as tolerated.  Discussed that GI side effects and worsening anxiety and depression can occur when starting these medications but should not persist longer than 2 weeks.  He can take up to 4 to 6 weeks to feel will benefit from SSRI therapy.  If she develops any suicidal thoughts or homicidal thoughts, she should discontinue the medication and seek care  immediately.  Also suggested therapy.  Patient is not interested at this time.  Follow-up in 2 to 4 weeks for reevaluation. - sertraline (ZOLOFT) 25 MG tablet; Take 1 tablet (25 mg total) by mouth daily for 7 days, THEN 2 tablets (50 mg total) daily.  Dispense: 173 tablet; Refill: 0 - hydrOXYzine (ATARAX/VISTARIL) 25 MG tablet; Take 0.5-1 tablets (12.5-25 mg total) by mouth every 8 (eight) hours as needed for anxiety.  Dispense: 60 tablet; Refill: 0  4. Muscle spasm History and physical exam findings consistent with muscle spasm.  Recommend daily stretching, heating pad, and muscle relaxants as needed.  Also suggested massage therapy.  Follow-up as needed. - baclofen (LIORESAL) 10 MG tablet; Take 1 tablet (10 mg total) by mouth 3 (three) times daily.  Dispense: 30 each; Refill: 0  Side effects, risks, benefits, and alternatives of the medications and treatment plan prescribed today were discussed, and patient expressed understanding  of the instructions given. No barriers to understanding were identified. Red flags discussed in detail. Pt expressed understanding regarding what to do in case of emergency/urgent symptoms.  Note - This record has been created using [employer].  Chart creation errors have been sought, but may not always  have been located. Such creation errors do not reflect on  the standard of medical care.  A total of 40 min was spent in the room with the patient, greater than 50% of which was in counseling/coordination of care regarding above.  Benjiman Core PA-C  Primary Care at [facility] 04/30/2018 12:06 PM

## 2018-05-01 ENCOUNTER — Encounter: Payer: Self-pay | Admitting: Physician Assistant

## 2018-05-07 ENCOUNTER — Encounter: Payer: Self-pay | Admitting: Gastroenterology

## 2018-05-10 NOTE — Progress Notes (Deleted)
Referring Provider: Magdalene RiverWiseman, Brittany D, PA* Primary Care Physician:  System, Provider Not In   Reason for Consultation:  Dysphagia   IMPRESSION:  Dysphagia  PLAN: ***   HPI: Nancy Osborne is a 57 y.o. female   Developed multiple side effects on Lexapro and BuSpar prescribed 04/13/18 including nausea, dysphagia, dizziness and worsening anxiety.  She took medications for about 2 weeks and then discontinued due to sx. Nausea, upset, and dizziness have improved since stopping the medication. Still having dysphagia. Globus sensation mostly with food, sometimes water.  Has associated heartburn and eructation.   Avoid spicy foods.  Chews food well before swallowing.  Has not tried any medication for relief.    Had a negative Cologuard in 2019.    Past Medical History:  Diagnosis Date  . Hypertension     No past surgical history on file.  Current Outpatient Medications  Medication Sig Dispense Refill  . amLODipine (NORVASC) 10 MG tablet Take 1 tablet (10 mg total) by mouth daily. 90 tablet 1  . baclofen (LIORESAL) 10 MG tablet Take 1 tablet (10 mg total) by mouth 3 (three) times daily. 30 each 0  . hydrOXYzine (ATARAX/VISTARIL) 25 MG tablet Take 0.5-1 tablets (12.5-25 mg total) by mouth every 8 (eight) hours as needed for anxiety. 60 tablet 0  . omeprazole (PRILOSEC) 20 MG capsule Take 1 capsule (20 mg total) by mouth daily. 30 capsule 3  . sertraline (ZOLOFT) 25 MG tablet Take 1 tablet (25 mg total) by mouth daily for 7 days, THEN 2 tablets (50 mg total) daily. 173 tablet 0   No current facility-administered medications for this visit.     Allergies as of 05/11/2018  . (No Known Allergies)    No family history on file.  Social History   Socioeconomic History  . Marital status: Married    Spouse name: Not on file  . Number of children: Not on file  . Years of education: Not on file  . Highest education level: Not on file  Occupational History  . Not on file  Social  Needs  . Financial resource strain: Not on file  . Food insecurity:    Worry: Not on file    Inability: Not on file  . Transportation needs:    Medical: Not on file    Non-medical: Not on file  Tobacco Use  . Smoking status: Never Smoker  . Smokeless tobacco: Never Used  Substance and Sexual Activity  . Alcohol use: No  . Drug use: No  . Sexual activity: Not on file  Lifestyle  . Physical activity:    Days per week: Not on file    Minutes per session: Not on file  . Stress: Not on file  Relationships  . Social connections:    Talks on phone: Not on file    Gets together: Not on file    Attends religious service: Not on file    Active member of club or organization: Not on file    Attends meetings of clubs or organizations: Not on file    Relationship status: Not on file  . Intimate partner violence:    Fear of current or ex partner: Not on file    Emotionally abused: Not on file    Physically abused: Not on file    Forced sexual activity: Not on file  Other Topics Concern  . Not on file  Social History Narrative  . Not on file    Review of  Systems: 12 system ROS is negative except as noted above.  There were no vitals filed for this visit.  Physical Exam: Vital signs were reviewed. General:   Alert, well-nourished, pleasant and cooperative in NAD Head:  Normocephalic and atraumatic. Eyes:  Sclera clear, no icterus.   Conjunctiva pink. Mouth:  No deformity or lesions.   Neck:  Supple; no thyromegaly. Lungs:  Clear throughout to auscultation.   No wheezes.  Heart:  Regular rate and rhythm; no murmurs Abdomen:  Soft, nontender, normal bowel sounds. No rebound or guarding. No hepatosplenomegaly Rectal:  Deferred  Msk:  Symmetrical without gross deformities. Extremities:  No gross deformities or edema. Neurologic:  Alert and  oriented x4;  grossly nonfocal Skin:  No rash or bruise. Psych:  Alert and cooperative. Normal mood and affect.   Nancy Osborne L. Orvan Falconer Md,  [facility] 05/10/2018, 4:42 PM

## 2018-05-11 ENCOUNTER — Ambulatory Visit: Payer: No Typology Code available for payment source | Admitting: Gastroenterology

## 2018-05-25 ENCOUNTER — Other Ambulatory Visit: Payer: Self-pay

## 2018-05-25 ENCOUNTER — Encounter: Payer: Self-pay | Admitting: Family Medicine

## 2018-05-25 ENCOUNTER — Ambulatory Visit (INDEPENDENT_AMBULATORY_CARE_PROVIDER_SITE_OTHER): Payer: No Typology Code available for payment source | Admitting: Family Medicine

## 2018-05-25 VITALS — BP 133/90 | HR 74 | Temp 98.8°F | Resp 14 | Ht 60.0 in | Wt 202.6 lb

## 2018-05-25 DIAGNOSIS — I10 Essential (primary) hypertension: Secondary | ICD-10-CM | POA: Diagnosis not present

## 2018-05-25 DIAGNOSIS — R7303 Prediabetes: Secondary | ICD-10-CM

## 2018-05-25 DIAGNOSIS — F22 Delusional disorders: Secondary | ICD-10-CM | POA: Diagnosis not present

## 2018-05-25 DIAGNOSIS — R634 Abnormal weight loss: Secondary | ICD-10-CM | POA: Diagnosis not present

## 2018-05-25 DIAGNOSIS — M62830 Muscle spasm of back: Secondary | ICD-10-CM

## 2018-05-25 DIAGNOSIS — R630 Anorexia: Secondary | ICD-10-CM | POA: Diagnosis not present

## 2018-05-25 DIAGNOSIS — F4322 Adjustment disorder with anxiety: Secondary | ICD-10-CM | POA: Diagnosis not present

## 2018-05-25 LAB — POCT CBC
Granulocyte percent: 58 %G (ref 37–80)
HEMATOCRIT: 40.8 % (ref 29–41)
HEMOGLOBIN: 13.7 g/dL — AB (ref 9.5–13.5)
LYMPH, POC: 1.8 (ref 0.6–3.4)
MCH: 27.5 pg (ref 27–31.2)
MCHC: 33.6 g/dL (ref 31.8–35.4)
MCV: 81.8 fL (ref 76–111)
MID (cbc): 0.5 (ref 0–0.9)
MPV: 7.9 fL (ref 0–99.8)
POC GRANULOCYTE: 3.2 (ref 2–6.9)
POC LYMPH %: 32.5 % (ref 10–50)
POC MID %: 9.5 %M (ref 0–12)
Platelet Count, POC: 301 10*3/uL (ref 142–424)
RBC: 4.98 M/uL (ref 4.04–5.48)
RDW, POC: 15.8 %
WBC: 5.6 10*3/uL (ref 4.6–10.2)

## 2018-05-25 LAB — POCT GLYCOSYLATED HEMOGLOBIN (HGB A1C): Hemoglobin A1C: 6 % — AB (ref 4.0–5.6)

## 2018-05-25 MED ORDER — DICLOFENAC SODIUM 1 % TD GEL: 2.0000 g | Freq: Four times a day (QID) | TRANSDERMAL | 1 refills | Status: DC

## 2018-05-25 MED FILL — DICLOFENAC SODIUM 1% GEL: 1 | 13 days supply | Qty: 100 | Fill #0

## 2018-05-25 NOTE — Progress Notes (Signed)
Chief Complaint  Patient presents with  . Follow-up    for BP last bp reading was 145/80 1 week ago, still feeling weak after meal, stop all med except the bp med cause off the way it was making me feel. Want to talk to the Dr about these feeling    HPI  Adjustment disorder  Paranoia Patient reports that she has been having worsening anxiety  She states that she also felt that she had worsening anxiety like someone is after her She reports that at work she would start thinking about making arrangements for her funeral She stopped taking the medication lexapro and zoloft Since stopping the zoloft she reports that the paranoid thoughts stopped  [facility] 05/25/2018 04/30/2018 04/13/2018 03/17/2017 11/25/2016  Decreased Interest 2 0 0 0 0  Down, Depressed, Hopeless 0 0 0 0 0  PHQ - 2 Score 2 0 0 0 0    She reports that she does not want to be on medications She states that she does not go to counseling  She reports that she felt like while she was driving to work she would feel like someone is after her while she was driving in the car and she has to defend herself. She stopped the zoloft and the anxiety has impacted her.   She is sleeping better but is using benadryl Her energy levels is very low and feels like she has to hold on to something like something is pushing her down into the ground. She reports that she feels weak after eating or if she does not eat.   Prediabetes Weight loss She reports that she is not eating much with low weight States that she is only eating a small meal a day  She reports that she gets poor appetite and since she was getting weak after eating she would avoid large meals. She is losing weight Wt Readings from Last 3 Encounters:  05/25/18 202 lb 9.6 oz (91.9 kg)  04/30/18 206 lb 12.8 oz (93.8 kg)  04/13/18 209 lb 9.6 oz (95.1 kg)    Hypertension Hypertension: Patient here for follow-up of elevated blood pressure. She is not  exercising and is adherent to low salt diet.  Blood pressure is well controlled at home. Cardiac symptoms none. Patient denies chest pain, chest pressure/discomfort, claudication, dyspnea, exertional chest pressure/discomfort, fatigue and lower extremity edema.  Cardiovascular risk factors: hypertension, obesity (BMI >= 30 kg/m2) and sedentary lifestyle. Use of agents associated with hypertension: none. History of target organ damage: none. BP Readings from Last 3 Encounters:  05/25/18 133/90  04/30/18 131/84  04/13/18 131/83     Past Medical History:  Diagnosis Date  . Hypertension     Current Outpatient Medications  Medication Sig Dispense Refill  . amLODipine (NORVASC) 10 MG tablet Take 1 tablet (10 mg total) by mouth daily. 90 tablet 1   No current facility-administered medications for this visit.     Allergies:  Allergies  Allergen Reactions  . Zoloft [Sertraline Hcl]     Anxiety and paranoia    No past surgical history on file.  Social History   Socioeconomic History  . Marital status: Married    Spouse name: Not on file  . Number of children: Not on file  . Years of education: Not on file  . Highest education level: Not on file  Occupational History  . Not on file  Social Needs  . Financial resource strain: Not on file  . Food insecurity:  Worry: Not on file    Inability: Not on file  . Transportation needs:    Medical: Not on file    Non-medical: Not on file  Tobacco Use  . Smoking status: Never Smoker  . Smokeless tobacco: Never Used  Substance and Sexual Activity  . Alcohol use: No  . Drug use: No  . Sexual activity: Not on file  Lifestyle  . Physical activity:    Days per week: Not on file    Minutes per session: Not on file  . Stress: Not on file  Relationships  . Social connections:    Talks on phone: Not on file    Gets together: Not on file    Attends religious service: Not on file    Active member of club or organization: Not on file      Attends meetings of clubs or organizations: Not on file    Relationship status: Not on file  Other Topics Concern  . Not on file  Social History Narrative  . Not on file    No family history on file.   ROS Review of Systems See HPI Constitution: No fevers or chills No malaise No diaphoresis Skin: No rash or itching Eyes: no blurry vision, no double vision GU: no dysuria or hematuria Neuro: no dizziness or headaches all others reviewed and negative   Objective: Vitals:   05/25/18 0954  BP: 133/90  Pulse: 74  Resp: 14  Temp: 98.8 F (37.1 C)  TempSrc: Oral  SpO2: 97%  Weight: 202 lb 9.6 oz (91.9 kg)  Height: 5' (1.524 m)    Physical Exam Physical Exam  Constitutional: Oriented to person, place, and time. Appears well-developed and well-nourished.  HENT:  Head: Normocephalic and atraumatic.  Eyes: Conjunctivae and EOM are normal.  Cardiovascular: Normal rate, regular rhythm, normal heart sounds and intact distal pulses.  No murmur heard. Pulmonary/Chest: Effort normal and breath sounds normal. No stridor. No respiratory distress. Has no wheezes.  Neurological: Is alert and oriented to person, place, and time.  Skin: Skin is warm. Capillary refill takes less than 2 seconds.  Psychiatric: Has a normal mood and affect. Behavior is normal. Judgment and thought content normal.     Assessment and Plan Nancy Osborne was seen today for follow-up.  Diagnoses and all orders for this visit:  Adjustment disorder with anxious mood- pt is still having severe anxiety about her life and death as well as depression complicated by grieving No suicidal thoughts -     Ambulatory referral to Psychiatry  Paranoia [facility])- improved with cessation of SSRIs -     Ambulatory referral to Psychiatry  Essential hypertension- Patient's blood pressure is at goal of 139/89 or less. Condition is stable. Continue current medications and treatment plan. I recommend that you exercise for 30-45  minutes 5 days a week. I also recommend a balanced diet with fruits and vegetables every day, lean meats, and little fried foods. The DASH diet (you can find this online) is a good example of this.   Loss of weight- due to poor intake -     POCT CBC -     TSH  Poor appetite- discussed eating smaller, more frequent meals -     POCT CBC -     TSH  Prediabetes- discussed that her a1c has improved, advised her to continue healthy weight loss -     POCT glycosylated hemoglobin (Hb A1C)  A total of 40 minutes were spent face-to-face with the patient  during this encounter and over half of that time was spent on counseling and coordination of care.    Rodgerick Gilliand A Donato Studley

## 2018-05-25 NOTE — Patient Instructions (Addendum)
Apply voltaren gel 3-4 times a day to the affected areas of the back If voltaren is not covered by insurance try Salonpas or Aspercreme with lidocaine    Back Exercises The following exercises strengthen the muscles that help to support the back. They also help to keep the lower back flexible. Doing these exercises can help to prevent back pain or lessen existing pain. If you have back pain or discomfort, try doing these exercises 2-3 times each day or as told by your health care provider. When the pain goes away, do them once each day, but increase the number of times that you repeat the steps for each exercise (do more repetitions). If you do not have back pain or discomfort, do these exercises once each day or as told by your health care provider. Exercises Single Knee to Chest  Repeat these steps 3-5 times for each leg: 1. Lie on your back on a firm bed or the floor with your legs extended. 2. Bring one knee to your chest. Your other leg should stay extended and in contact with the floor. 3. Hold your knee in place by grabbing your knee or thigh. 4. Pull on your knee until you feel a gentle stretch in your lower back. 5. Hold the stretch for 10-30 seconds. 6. Slowly release and straighten your leg.  Pelvic Tilt  Repeat these steps 5-10 times: 1. Lie on your back on a firm bed or the floor with your legs extended. 2. Bend your knees so they are pointing toward the ceiling and your feet are flat on the floor. 3. Tighten your lower abdominal muscles to press your lower back against the floor. This motion will tilt your pelvis so your tailbone points up toward the ceiling instead of pointing to your feet or the floor. 4. With gentle tension and even breathing, hold this position for 5-10 seconds.  Cat-Cow  Repeat these steps until your lower back becomes more flexible: 1. Get into a hands-and-knees position on a firm surface. Keep your hands under your shoulders, and keep your knees  under your hips. You may place padding under your knees for comfort. 2. Let your head hang down, and point your tailbone toward the floor so your lower back becomes rounded like the back of a cat. 3. Hold this position for 5 seconds. 4. Slowly lift your head and point your tailbone up toward the ceiling so your back forms a sagging arch like the back of a cow. 5. Hold this position for 5 seconds.  Press-Ups  Repeat these steps 5-10 times: 1. Lie on your abdomen (face-down) on the floor. 2. Place your palms near your head, about shoulder-width apart. 3. While you keep your back as relaxed as possible and keep your hips on the floor, slowly straighten your arms to raise the top half of your body and lift your shoulders. Do not use your back muscles to raise your upper torso. You may adjust the placement of your hands to make yourself more comfortable. 4. Hold this position for 5 seconds while you keep your back relaxed. 5. Slowly return to lying flat on the floor.  Bridges  Repeat these steps 10 times: 1. Lie on your back on a firm surface. 2. Bend your knees so they are pointing toward the ceiling and your feet are flat on the floor. 3. Tighten your buttocks muscles and lift your buttocks off of the floor until your waist is at almost the same height as your  knees. You should feel the muscles working in your buttocks and the back of your thighs. If you do not feel these muscles, slide your feet 1-2 inches farther away from your buttocks. 4. Hold this position for 3-5 seconds. 5. Slowly lower your hips to the starting position, and allow your buttocks muscles to relax completely.  If this exercise is too easy, try doing it with your arms crossed over your chest. Abdominal Crunches  Repeat these steps 5-10 times: 1. Lie on your back on a firm bed or the floor with your legs extended. 2. Bend your knees so they are pointing toward the ceiling and your feet are flat on the floor. 3. Cross  your arms over your chest. 4. Tip your chin slightly toward your chest without bending your neck. 5. Tighten your abdominal muscles and slowly raise your trunk (torso) high enough to lift your shoulder blades a tiny bit off of the floor. Avoid raising your torso higher than that, because it can put too much stress on your low back and it does not help to strengthen your abdominal muscles. 6. Slowly return to your starting position.  Back Lifts Repeat these steps 5-10 times: 1. Lie on your abdomen (face-down) with your arms at your sides, and rest your forehead on the floor. 2. Tighten the muscles in your legs and your buttocks. 3. Slowly lift your chest off of the floor while you keep your hips pressed to the floor. Keep the back of your head in line with the curve in your back. Your eyes should be looking at the floor. 4. Hold this position for 3-5 seconds. 5. Slowly return to your starting position.  Contact a health care provider if:  Your back pain or discomfort gets much worse when you do an exercise.  Your back pain or discomfort does not lessen within 2 hours after you exercise. If you have any of these problems, stop doing these exercises right away. Do not do them again unless your health care provider says that you can. Get help right away if:  You develop sudden, severe back pain. If this happens, stop doing the exercises right away. Do not do them again unless your health care provider says that you can. This information is not intended to replace advice given to you by your health care provider. Make sure you discuss any questions you have with your health care provider. Document Released: 07/17/2004 Document Revised: 10/17/2015 Document Reviewed: 08/03/2014 Elsevier Interactive Patient Education  2017 [employer]

## 2018-05-26 LAB — TSH: TSH: 0.938 u[IU]/mL (ref 0.450–4.500)

## 2018-05-27 ENCOUNTER — Encounter: Payer: Self-pay | Admitting: Family Medicine

## 2018-06-03 MED FILL — DICLOFENAC SODIUM 1% GEL: 1 | 13 days supply | Qty: 100 | Fill #0

## 2018-07-02 MED FILL — CHLORHEXIDINE 0.12% RINSE: 0.12 | 17 days supply | Qty: 473 | Fill #0

## 2018-07-02 MED FILL — HYDROCODON-APAP 5-325: 5-325 | 2 days supply | Qty: 10 | Fill #0

## 2018-07-02 MED FILL — CHLORTHALIDONE 25 MG TABS: 25 | 30 days supply | Qty: 30 | Fill #0

## 2018-07-02 MED FILL — AMLODIPINE BESYLATE 10 MG T: 10 | 90 days supply | Qty: 90 | Fill #1

## 2018-07-08 ENCOUNTER — Ambulatory Visit: Payer: Self-pay | Admitting: *Deleted

## 2018-07-08 NOTE — Telephone Encounter (Signed)
Pt called with complaints of dizziness 10 min after eating, dry cough and drinking a lot of water, tired, and weight loss x 4 weeks;  Recommendations made per nurse triage protocol; assisted by [facility] to offer pt appointment with Dr Collie Siad; pt offered and accepted appointment with Dr Creta Levin, [address] [place],  07/10/2018 at 0820; she verbalized understanding. Reason for Disposition . [1] MODERATE dizziness (e.g., interferes with normal activities) AND [2] has been evaluated by physician for this  Answer Assessment - Initial Assessment Questions 1. DESCRIPTION: "Describe your dizziness."     Feels like "I have to hold onto something and get up real slow" 2. LIGHTHEADED: "Do you feel lightheaded?" (e.g., somewhat faint, woozy, weak upon standing)     Weak upon standing 3. VERTIGO: "Do you feel like either you or the room is spinning or tilting?" (i.e. vertigo)     no 4. SEVERITY: "How bad is it?"  "Do you feel like you are going to faint?" "Can you stand and walk?"   - MILD - walking normally   - MODERATE - interferes with normal activities (e.g., work, school)    - SEVERE - unable to stand, requires support to walk, feels like passing out now.      moderate 5. ONSET:  "When did the dizziness begin?"     4 weeks ago 6. AGGRAVATING FACTORS: "Does anything make it worse?" (e.g., standing, change in head position)     standing 7. HEART RATE: "Can you tell me your heart rate?" "How many beats in 15 seconds?"  (Note: not all patients can do this)       Pt can not take this task 8. CAUSE: "What do you think is causing the dizziness?"     Not sure, maybe BP medication 9. RECURRENT SYMPTOM: "Have you had dizziness before?" If so, ask: "When was the last time?" "What happened that time?"     no 10. OTHER SYMPTOMS: "Do you have any other symptoms?" (e.g., fever, chest pain, vomiting, diarrhea, bleeding)       Weight loss, dry cough, increased thirst  11. PREGNANCY: "Is there any chance you  are pregnant?" "When was your last menstrual period?"       no  Protocols used: DIZZINESS [facility]

## 2018-07-10 ENCOUNTER — Ambulatory Visit (INDEPENDENT_AMBULATORY_CARE_PROVIDER_SITE_OTHER): Payer: No Typology Code available for payment source | Admitting: Family Medicine

## 2018-07-10 ENCOUNTER — Encounter: Payer: Self-pay | Admitting: Family Medicine

## 2018-07-10 ENCOUNTER — Other Ambulatory Visit: Payer: Self-pay

## 2018-07-10 VITALS — BP 132/88 | HR 66 | Temp 98.4°F | Ht 59.0 in | Wt 192.0 lb

## 2018-07-10 DIAGNOSIS — I10 Essential (primary) hypertension: Secondary | ICD-10-CM

## 2018-07-10 DIAGNOSIS — R634 Abnormal weight loss: Secondary | ICD-10-CM

## 2018-07-10 DIAGNOSIS — Z634 Disappearance and death of family member: Secondary | ICD-10-CM

## 2018-07-10 DIAGNOSIS — R627 Adult failure to thrive: Secondary | ICD-10-CM

## 2018-07-10 DIAGNOSIS — F4329 Adjustment disorder with other symptoms: Secondary | ICD-10-CM

## 2018-07-10 DIAGNOSIS — F5089 Other specified eating disorder: Secondary | ICD-10-CM

## 2018-07-10 DIAGNOSIS — F451 Undifferentiated somatoform disorder: Secondary | ICD-10-CM | POA: Diagnosis not present

## 2018-07-10 DIAGNOSIS — F4321 Adjustment disorder with depressed mood: Secondary | ICD-10-CM

## 2018-07-10 NOTE — Progress Notes (Signed)
Established Patient Office Visit  Subjective:  Patient ID: Nancy Osborne, female    DOB: Jan 20, 1961  Age: 58 y.o. MRN: [id number]  CC:  Chief Complaint  Patient presents with  . Weight Loss    lost 10 pounds with 4 weeks without trying   . Anorexia    whenever she eats she has been feeling sick     HPI Nancy Osborne presents for  She reports that she is having  She quit her job because she cannot concentrate at work She does not understand what is happening to her body She states that she was fine previously on her bp medications.  She states that was started having issues after a year with not wanting to eat and feeling a sensation of difficulty with breathing.  She is tired  She eats 1/2 cup of solids and tries to drink ensure but she has to add water to the ensure because she is not able to drink the thick ensure.   BP Readings from Last 3 Encounters:  07/10/18 132/88  05/25/18 133/90  04/30/18 131/84   She states that she wants to feel like her self again and to get her motivation again She things that it is the side effect of the medications.  She gets palpitations when she is feeling anxious and then takes an aspirin which seems to calm her down. She reports that she sleeps for 4-5 hours at night and cannot go back to sleep She tried lexapro and buspar in the past but did not like to feel  She reports that since stopping the antidepressants there is no paranoia.   Depression screen [facility] 2/9 07/10/2018 05/25/2018 04/30/2018 04/13/2018 03/17/2017  Decreased Interest 0 2 0 0 0  Down, Depressed, Hopeless 0 0 0 0 0  PHQ - 2 Score 0 2 0 0 0    She reports that she has to take a laxative tea over the counter She states that she has a bowel movement once every 10-14 days She drinks 5 bottles of water a day and drinks beet juice She is not taking a multivitamin   Past Medical History:  Diagnosis Date  . Hypertension     No past surgical history on file.  No family history  on file.  Social History   Socioeconomic History  . Marital status: Married    Spouse name: Not on file  . Number of children: Not on file  . Years of education: Not on file  . Highest education level: Not on file  Occupational History  . Not on file  Social Needs  . Financial resource strain: Not on file  . Food insecurity:    Worry: Not on file    Inability: Not on file  . Transportation needs:    Medical: Not on file    Non-medical: Not on file  Tobacco Use  . Smoking status: Never Smoker  . Smokeless tobacco: Never Used  Substance and Sexual Activity  . Alcohol use: No  . Drug use: No  . Sexual activity: Not on file  Lifestyle  . Physical activity:    Days per week: Not on file    Minutes per session: Not on file  . Stress: Not on file  Relationships  . Social connections:    Talks on phone: Not on file    Gets together: Not on file    Attends religious service: Not on file    Active member of club or organization:  Not on file    Attends meetings of clubs or organizations: Not on file    Relationship status: Not on file  . Intimate partner violence:    Fear of current or ex partner: Not on file    Emotionally abused: Not on file    Physically abused: Not on file    Forced sexual activity: Not on file  Other Topics Concern  . Not on file  Social History Narrative  . Not on file    Outpatient Medications Prior to Visit  Medication Sig Dispense Refill  . amLODipine (NORVASC) 10 MG tablet Take 1 tablet (10 mg total) by mouth daily. 90 tablet 1  . diclofenac sodium (VOLTAREN) 1 % GEL Apply 2 g topically 4 (four) times daily. 100 g 1  . Multiple Vitamins-Minerals (MULTIVITAMIN ADULT EXTRA C PO) multivitamin     No facility-administered medications prior to visit.     Allergies  Allergen Reactions  . Zoloft [Sertraline Hcl]     Anxiety and paranoia    ROS Review of Systems    Objective:    Physical Exam  BP 132/88 (BP Location: Right Arm, Patient  Position: Sitting, Cuff Size: Large)   Pulse 66   Temp 98.4 F (36.9 C) (Oral)   Ht 4' 11" (1.499 m)   Wt 192 lb (87.1 kg)   LMP 07/21/2011   SpO2 97%   BMI 38.78 kg/m  Wt Readings from Last 3 Encounters:  07/10/18 192 lb (87.1 kg)  05/25/18 202 lb 9.6 oz (91.9 kg)  04/30/18 206 lb 12.8 oz (93.8 kg)   Physical Exam  Constitutional: Oriented to person, place, and time. Appears well-developed and well-nourished.  HENT:  Head: Normocephalic and atraumatic.  Eyes: Conjunctivae and EOM are normal.  Cardiovascular: Normal rate, regular rhythm, normal heart sounds and intact distal pulses.  No murmur heard. Pulmonary/Chest: Effort normal and breath sounds normal. No stridor. No respiratory distress. Has no wheezes.  Neurological: Is alert and oriented to person, place, and time.  Skin: Skin is warm. Capillary refill takes less than 2 seconds.  Psychiatric: Has a normal mood and affect. Behavior is normal. Judgment and thought content normal.    Health Maintenance Due  Topic Date Due  . MAMMOGRAM  08/10/2013    There are no preventive care reminders to display for this patient.  Lab Results  Component Value Date   TSH 0.938 05/25/2018   Lab Results  Component Value Date   WBC 5.6 05/25/2018   HGB 13.7 (A) 05/25/2018   HCT 40.8 05/25/2018   MCV 81.8 05/25/2018   PLT 329 04/12/2018   Lab Results  Component Value Date   NA 140 04/12/2018   K 3.5 04/12/2018   CO2 28 04/12/2018   GLUCOSE 109 (H) 04/12/2018   BUN 11 04/12/2018   CREATININE 0.85 04/12/2018   BILITOT 0.3 09/15/2017   ALKPHOS 114 09/15/2017   AST 20 09/15/2017   ALT 15 09/15/2017   PROT 7.4 09/15/2017   ALBUMIN 4.3 09/15/2017   CALCIUM 9.4 04/12/2018   ANIONGAP 11 04/12/2018   Lab Results  Component Value Date   CHOL 212 (H) 09/15/2017   Lab Results  Component Value Date   HDL 75 09/15/2017   Lab Results  Component Value Date   LDLCALC 127 (H) 09/15/2017   Lab Results  Component Value Date     TRIG 51 09/15/2017   Lab Results  Component Value Date   CHOLHDL 2.8 09/15/2017   Lab Results  Component Value Date   HGBA1C 6.0 (A) 05/25/2018      Assessment & Plan:   Problem List Items Addressed This Visit      Cardiovascular and Mediastinum   Essential hypertension    Other Visit Diagnoses    Complicated grieving    -  Primary   Relevant Orders   Ambulatory referral to Psychiatry   Somatic symptom disorder       Relevant Orders   Ambulatory referral to Psychiatry   Other disorder of eating       Relevant Orders   Ambulatory referral to Psychiatry   CMP14+EGFR   Prealbumin   Loss of weight       Relevant Orders   CMP14+EGFR   Prealbumin   TSH + free T4   Failure to thrive in adult       Relevant Orders   CMP14+EGFR   Prealbumin   TSH + free T4      Medical decision making   This is a 58yo female with a history of hypertension who started having issues with anxiety, panic attacks, chest pains who was seen starting October 2019 for ER follow up  She is continuing to have somatic disorder, anxiety and now failure to thrive.  She did not tolerate zoloft, lexapro, buspar and previously did not agree to [facility].  She would benefit from [facility] intervention. Discussed to stop the amlodipine and to monitor her bp only at the office as her ambulatory monitoring leads to her having more stress and anxiety.  Will also refer to Psychiatry since everything seemed to start after her acute grief reaction.   A total of 60 minutes were spent face-to-face with the patient during this encounter and over half of that time was spent on counseling and coordination of care.   No orders of the defined types were placed in this encounter.   Follow-up: No follow-ups on file.    Forrest Moron, MD

## 2018-07-10 NOTE — Patient Instructions (Addendum)
Follow up on 07/23/2018 for blood pressure check I will double book your appointment  This visit is for the purpose of a blood pressure follow up OFF amlodipine You do not need to fast for this appointment    If you have lab work done today you will be contacted with your lab results within the next 2 weeks.  If you have not heard from [place] then please contact us. The fastest way to get your results is to register for My Chart.   IF you received an x-ray today, you will receive an invoice from [facility]. Please contact [facility] at [phone] with questions or concerns regarding your invoice.   IF you received labwork today, you will receive an invoice from [facility]. Please contact [facility] at [phone] with questions or concerns regarding your invoice.   Our billing staff will not be able to assist you with questions regarding bills from these companies.  You will be contacted with the lab results as soon as they are available. The fastest way to get your results is to activate your My Chart account. Instructions are located on the last page of this paperwork. If you have not heard from [place] regarding the results in 2 weeks, please contact this office.     Failure to Thrive, Adult Failure to thrive is a group of symptoms that affect elderly adults. These symptoms include loss of appetite and weight loss. People who have this condition may do fewer and fewer activities over time. They may lose interest in being with friends or they may not want to eat or drink. This condition is not a normal part of aging. What are the causes? This condition may be caused by:  A disease, such dementia, diabetes, cancer, or lung disease.  A health problem, such as a vitamin deficiency or a heart problem.  A disorder, such as depression.  A disability.  Medicines.  Mistreatment or neglect. In some cases, the cause may not be known. What are the signs or symptoms? Symptoms  of this condition include:  Loss of more than 5% of your body weight.  Being more tired than normal after an activity.  Having trouble getting up after sitting.  Loss of appetite.  Not getting out of bed.  Not wanting to do usual activities.  Depression.  Getting infections often.  Bedsores.  Taking a long time to recover after an injury or a surgery.  Weakness. How is this diagnosed? This condition may be diagnosed with a physical exam. Your health care provider will ask questions about your health, behavior, and mood, such as:  Has your activity changed?  Do you seem sad?  Are your eating habits different? Tests may also be done. They may include:  Blood tests.  Urine tests.  Imaging tests, such as X-rays, a CT scan, or MRI.  Hearing tests.  Vision tests.  Tests to check thinking ability (cognitive tests).  Activity tests to see if you can do tasks such as bathing and dressing and to see if you can move around safely. You may be referred to a specialist. How is this treated? Treatment for this condition depends on the cause. It may involve:  Treating the cause.  Talk therapy or medicine to treat depression.  Improving diet, such as by eating more often or taking nutritional supplements.  Changing or stopping a medicine.  Physical therapy. It often takes a team of health care providers to find the right treatment. Follow these instructions at  home:  Take over-the-counter and prescription medicines only as told by your health care provider.  Eat a healthy, well-balanced diet. Make sure to get enough calories in each meal.  Be physically active. Include strength training as part of your exercise routine. A physical therapist can help to set up an exercise program that fits you.  Make sure that you are safe at home.  Make sure that you have a plan for what to do if you become unable to make decisions for yourself. Contact a health care provider  if:  You are not able to eat well.  You are not able to move around.  You feel very sad or hopeless. Get help right away if:  You have thoughts of ending your life.  You cannot eat or drink.  You do not get out of bed.  Staying at home is no longer safe.  You have a fever. This information is not intended to replace advice given to you by your health care provider. Make sure you discuss any questions you have with your health care provider. Document Released: 09/01/2011 Document Revised: 11/15/2015 Document Reviewed: 09/04/2014 Elsevier Interactive Patient Education  2019 [employer].

## 2018-07-11 LAB — PREALBUMIN: PREALBUMIN: 29 mg/dL (ref 10–36)

## 2018-07-11 LAB — CMP14+EGFR
A/G RATIO: 1.5 (ref 1.2–2.2)
ALBUMIN: 4.3 g/dL (ref 3.5–5.5)
ALT: 14 IU/L (ref 0–32)
AST: 16 IU/L (ref 0–40)
Alkaline Phosphatase: 89 IU/L (ref 39–117)
BILIRUBIN TOTAL: 0.3 mg/dL (ref 0.0–1.2)
BUN / CREAT RATIO: 7 — AB (ref 9–23)
BUN: 7 mg/dL (ref 6–24)
CO2: 25 mmol/L (ref 20–29)
Calcium: 9.7 mg/dL (ref 8.7–10.2)
Chloride: 100 mmol/L (ref 96–106)
Creatinine, Ser: 1.05 mg/dL — ABNORMAL HIGH (ref 0.57–1.00)
GFR calc Af Amer: 68 mL/min/{1.73_m2} (ref >59)
GFR calc non Af Amer: 59 mL/min/{1.73_m2} — ABNORMAL LOW (ref >59)
GLOBULIN, TOTAL: 2.9 g/dL (ref 1.5–4.5)
Glucose: 113 mg/dL — ABNORMAL HIGH (ref 65–99)
POTASSIUM: 3.9 mmol/L (ref 3.5–5.2)
Sodium: 143 mmol/L (ref 134–144)
TOTAL PROTEIN: 7.2 g/dL (ref 6.0–8.5)

## 2018-07-11 LAB — TSH+FREE T4
Free T4: 1.5 ng/dL (ref 0.82–1.77)
TSH: 0.708 u[IU]/mL (ref 0.450–4.500)

## 2018-07-16 LAB — RESULTS CONSOLE HPV: CHL HPV: NEGATIVE

## 2018-07-16 LAB — HM PAP SMEAR: HM Pap smear: NORMAL

## 2018-07-23 ENCOUNTER — Ambulatory Visit (INDEPENDENT_AMBULATORY_CARE_PROVIDER_SITE_OTHER): Payer: No Typology Code available for payment source | Admitting: Family Medicine

## 2018-07-23 ENCOUNTER — Other Ambulatory Visit: Payer: Self-pay

## 2018-07-23 ENCOUNTER — Encounter: Payer: Self-pay | Admitting: Family Medicine

## 2018-07-23 VITALS — BP 138/92 | HR 58 | Temp 98.5°F | Resp 16 | Ht 59.0 in | Wt 192.8 lb

## 2018-07-23 DIAGNOSIS — M62830 Muscle spasm of back: Secondary | ICD-10-CM | POA: Insufficient documentation

## 2018-07-23 DIAGNOSIS — Z634 Disappearance and death of family member: Secondary | ICD-10-CM

## 2018-07-23 DIAGNOSIS — F451 Undifferentiated somatoform disorder: Secondary | ICD-10-CM

## 2018-07-23 DIAGNOSIS — I10 Essential (primary) hypertension: Secondary | ICD-10-CM

## 2018-07-23 DIAGNOSIS — F4329 Adjustment disorder with other symptoms: Secondary | ICD-10-CM | POA: Diagnosis not present

## 2018-07-23 DIAGNOSIS — F4321 Adjustment disorder with depressed mood: Secondary | ICD-10-CM

## 2018-07-23 DIAGNOSIS — R630 Anorexia: Secondary | ICD-10-CM | POA: Insufficient documentation

## 2018-07-23 MED ORDER — DICLOFENAC SODIUM 1 % TD GEL: 2.0000 g | Freq: Four times a day (QID) | TRANSDERMAL | 1 refills | Status: DC

## 2018-07-23 NOTE — Assessment & Plan Note (Signed)
Continue voltaren gel and stretching

## 2018-07-23 NOTE — Assessment & Plan Note (Signed)
Discussed making nutritious food choices. She uses meal replacements like ensure to help with calories

## 2018-07-23 NOTE — Assessment & Plan Note (Signed)
Referral placed for Psychiatry Discussed that she will need to get her depression and grief treated before we pursue the symptom of dysphagia

## 2018-07-23 NOTE — Assessment & Plan Note (Signed)
Blood pressure controlled with diet off meds. Discussed that she should avoid NSAIDs which can raise her bp. Continue DASH diet.

## 2018-07-23 NOTE — Assessment & Plan Note (Signed)
Appt with Psychiatry on 08/03/2018. Would recommend pt start there as she has numerous somatic symptoms that have their roots in her grieving episode.

## 2018-07-23 NOTE — Assessment & Plan Note (Signed)
Weight improved due to caloric restriction. Explained that the weight loss needs to be in a healthy way.

## 2018-07-23 NOTE — Patient Instructions (Addendum)
If you have lab work done today you will be contacted with your lab results within the next 2 weeks.  If you have not heard from [place] then please contact us. The fastest way to get your results is to register for My Chart.   IF you received an x-ray today, you will receive an invoice from [facility]. Please contact [facility] at [phone] with questions or concerns regarding your invoice.   IF you received labwork today, you will receive an invoice from [place]. Please contact [facility] at [phone] with questions or concerns regarding your invoice.   Our billing staff will not be able to assist you with questions regarding bills from these companies.  You will be contacted with the lab results as soon as they are available. The fastest way to get your results is to activate your My Chart account. Instructions are located on the last page of this paperwork. If you have not heard from [place] regarding the results in 2 weeks, please contact this office.     Complicated Grief Grief is a normal response to the death of someone close to you. Feelings of fear, anger, and guilt can affect almost everyone who loses a loved one. It is also common to have symptoms of depression while you are grieving. These include problems with sleep, loss of appetite, and lack of energy. They may last for weeks or months after a loss. Complicated grief is different from normal grief or depression. Normal grieving involves sadness and feelings of loss, but those feelings get better and heal over time. Complicated grief is a severe type of grief that lasts for a long time, usually for several months to a year or longer. It interferes with your ability to function normally. Complicated grief may require treatment from a mental health care provider. What are the causes? The cause of this condition is not known. It is not clear why some people continue to struggle with grief and others do not. What  increases the risk? You are more likely to develop this condition if:  The death of your loved one was sudden or unexpected.  The death of your loved one was due to a violent event.  Your loved one died from suicide.  Your loved one was a child or a young person.  You were very close to your loved one, or you were dependent on him or her.  You have a history of depression or anxiety. What are the signs or symptoms? Symptoms of this condition include:  Feeling disbelief or having a lack of emotion (numbness).  Being unable to enjoy good memories of your loved one.  Needing to avoid anything or anyone that reminds you of your loved one.  Being unable to stop thinking about the death.  Feeling intense anger or guilt.  Feeling alone and hopeless.  Feeling that your life is meaningless and empty.  Losing the desire to move on with your life. How is this diagnosed? This condition may be diagnosed based on:  Your symptoms. Complicated grief will be diagnosed if you have ongoing symptoms of grief for 6-12 months or longer.  The effect of symptoms on your life. You may be diagnosed with this condition if your symptoms are interfering with your ability to live your life. Your health care provider may recommend that you see a mental health care provider. Many symptoms of depression are similar to the symptoms of complicated grief. It is important to be evaluated  for complicated grief along with other mental health conditions. How is this treated? This condition is most commonly treated with talk therapy. This therapy is offered by a mental health specialist (psychiatrist). During therapy:  You will learn healthy ways to cope with the loss of your loved one.  Your mental health care provider may recommend antidepressant medicines. Follow these instructions at home: Lifestyle   Take care of yourself. ? Eat on a regular basis, and maintain a healthy diet. Eat plenty of fruits,  vegetables, lean protein, and whole grains. ? Try to get some exercise each day. Aim for 30 minutes of exercise on most days of the week. ? Keep a consistent sleep schedule. Try to get 8 or more hours of sleep each night. ? Start doing the things that you used to enjoy.  Do not use drugs or alcohol to ease your symptoms.  Spend time with friends and loved ones. General instructions  Take over-the-counter and prescription medicines only as told by your health care provider.  Consider joining a grief (bereavement) support group to help you deal with your loss.  Keep all follow-up visits as told by your health care provider. This is important. Contact a health care provider if:  Your symptoms prevent you from functioning normally.  Your symptoms do not get better with treatment. Get help right away if:  You have serious thoughts about hurting yourself or someone else.  You have suicidal feelings. If you ever feel like you may hurt yourself or others, or have thoughts about taking your own life, get help right away. You can go to your nearest emergency department or call:  Your local emergency services (911 in the [place]  A suicide crisis helpline, such as the National Suicide Prevention Lifeline at 469-034-6193. This is open 24 hours a day. Summary  Complicated grief is a severe type of grief that lasts for a long time. This grief is not likely to go away on its own. Get the help you need.  Some griefs are more difficult than others and can cause this condition. You may need a certain type of treatment to help you recover if the loss of your loved one was sudden, violent, or due to suicide.  You may feel guilty about moving on with your life. Getting help does not mean that you are forgetting your loved one. It means that you are taking care of yourself.  Complicated grief is best treated with talk therapy. Medicines may also be prescribed.  Seek the help you need, and find  support that will help you recover. This information is not intended to replace advice given to you by your health care provider. Make sure you discuss any questions you have with your health care provider. Document Released: 06/09/2005 Document Revised: 03/25/2017 Document Reviewed: 03/25/2017 Elsevier Interactive Patient Education  2019 [employer].

## 2018-07-23 NOTE — Progress Notes (Signed)
Established Patient Office Visit  Subjective:  Patient ID: Nancy Osborne, female    DOB: May 15, 1961  Age: 58 y.o. MRN: [id number]  CC:  Chief Complaint  Patient presents with  . Blood Pressure Check    d'continued amlodipine x 2 weeks per Treyce Spillers.  Per pt bp last week at Dr Debria GarretStringer's office was 124/ 80    HPI [employer] Airways[employer]is N Osborne presents for   Weight loss  She reports that she is still having the trouble with eating She states that she still has low energy She is now off the amlodipine and her bp at Dr. Debria GarretStringer's office 124/62 She is drinking ensure plus which she mixes with 2% milk and pours it over oatmeal with blueberries She still has trouble with a cough and a sense of difficulty swallowing.  Wt Readings from Last 3 Encounters:  07/23/18 192 lb 12.8 oz (87.5 kg)  07/10/18 192 lb (87.1 kg)  05/25/18 202 lb 9.6 oz (91.9 kg)   She reports that the cough is nonproductive. She is still having back pain on the right side of her back and after rubbing the ointment on it she gets recurrent pain   Hypertension: Patient here for follow-up of elevated blood pressure. She is not exercising and is adherent to low salt diet.  Blood pressure is well controlled at home. Cardiac symptoms none. Patient denies chest pain, chest pressure/discomfort, dyspnea, fatigue, lower extremity edema, near-syncope, orthopnea and palpitations.  Cardiovascular risk factors: hypertension and obesity (BMI >= 30 kg/m2). Use of agents associated with hypertension: none. History of target organ damage: none. BP Readings from Last 3 Encounters:  07/23/18 (!) 138/92  07/10/18 132/88  05/25/18 133/90     Complicated Griefing She does not feel motivated to do anything She just wants to feel like herself She reports that she never been through anything like this before  [facility] 07/23/2018 07/10/2018 05/25/2018 04/30/2018 04/13/2018  Decreased Interest 0 0 2 0 0  Down, Depressed, Hopeless 0 0 0 0 0   PHQ - 2 Score 0 0 2 0 0     Past Medical History:  Diagnosis Date  . Hypertension     No past surgical history on file.  No family history on file.  Social History   Socioeconomic History  . Marital status: Married    Spouse name: Not on file  . Number of children: Not on file  . Years of education: Not on file  . Highest education level: Not on file  Occupational History  . Not on file  Social Needs  . Financial resource strain: Not on file  . Food insecurity:    Worry: Not on file    Inability: Not on file  . Transportation needs:    Medical: Not on file    Non-medical: Not on file  Tobacco Use  . Smoking status: Never Smoker  . Smokeless tobacco: Never Used  Substance and Sexual Activity  . Alcohol use: No  . Drug use: No  . Sexual activity: Not on file  Lifestyle  . Physical activity:    Days per week: Not on file    Minutes per session: Not on file  . Stress: Not on file  Relationships  . Social connections:    Talks on phone: Not on file    Gets together: Not on file    Attends religious service: Not on file    Active member of club or organization: Not on file  Attends meetings of clubs or organizations: Not on file    Relationship status: Not on file  . Intimate partner violence:    Fear of current or ex partner: Not on file    Emotionally abused: Not on file    Physically abused: Not on file    Forced sexual activity: Not on file  Other Topics Concern  . Not on file  Social History Narrative  . Not on file    Outpatient Medications Prior to Visit  Medication Sig Dispense Refill  . Multiple Vitamins-Minerals (MULTIVITAMIN ADULT EXTRA C PO) multivitamin    . amLODipine (NORVASC) 10 MG tablet Take 1 tablet (10 mg total) by mouth daily. (Patient not taking: Reported on 07/23/2018) 90 tablet 1  . diclofenac sodium (VOLTAREN) 1 % GEL Apply 2 g topically 4 (four) times daily. (Patient not taking: Reported on 07/23/2018) 100 g 1   No  facility-administered medications prior to visit.     Allergies  Allergen Reactions  . Sertraline Hcl Other (See Comments)    Anxiety and paranoia    ROS Review of Systems See hpi   Objective:    Physical Exam  BP (!) 138/92 (BP Location: Left Arm, Patient Position: Sitting, Cuff Size: Large)   Pulse (!) 58   Temp 98.5 F (36.9 C) (Oral)   Resp 16   Ht 4\' 11"  (1.499 m)   Wt 192 lb 12.8 oz (87.5 kg)   LMP 07/21/2011   SpO2 99%   BMI 38.94 kg/m  Wt Readings from Last 3 Encounters:  07/23/18 192 lb 12.8 oz (87.5 kg)  07/10/18 192 lb (87.1 kg)  05/25/18 202 lb 9.6 oz (91.9 kg)   Physical Exam  Constitutional: Oriented to person, place, and time. Appears well-developed and well-nourished.  HENT:  Head: Normocephalic and atraumatic.  Eyes: Conjunctivae and EOM are normal.  Cardiovascular: Normal rate, regular rhythm, normal heart sounds and intact distal pulses.  No murmur heard. Pulmonary/Chest: Effort normal and breath sounds normal. No stridor. No respiratory distress. Has no wheezes.  Neurological: Is alert and oriented to person, place, and time.  Skin: Skin is warm. Capillary refill takes less than 2 seconds.  Psychiatric: Has a normal mood and affect. Behavior is normal. Judgment and thought content normal.    There are no preventive care reminders to display for this patient.  There are no preventive care reminders to display for this patient.  Lab Results  Component Value Date   TSH 0.708 07/10/2018   Lab Results  Component Value Date   WBC 5.6 05/25/2018   HGB 13.7 (A) 05/25/2018   HCT 40.8 05/25/2018   MCV 81.8 05/25/2018   PLT 329 04/12/2018   Lab Results  Component Value Date   NA 143 07/10/2018   K 3.9 07/10/2018   CO2 25 07/10/2018   GLUCOSE 113 (H) 07/10/2018   BUN 7 07/10/2018   CREATININE 1.05 (H) 07/10/2018   BILITOT 0.3 07/10/2018   ALKPHOS 89 07/10/2018   AST 16 07/10/2018   ALT 14 07/10/2018   PROT 7.2 07/10/2018   ALBUMIN 4.3  07/10/2018   CALCIUM 9.7 07/10/2018   ANIONGAP 11 04/12/2018   Lab Results  Component Value Date   CHOL 212 (H) 09/15/2017   Lab Results  Component Value Date   HDL 75 09/15/2017   Lab Results  Component Value Date   LDLCALC 127 (H) 09/15/2017   Lab Results  Component Value Date   TRIG 51 09/15/2017   Lab Results  Component Value Date   CHOLHDL 2.8 09/15/2017   Lab Results  Component Value Date   HGBA1C 6.0 (A) 05/25/2018      Assessment & Plan:   Problem List Items Addressed This Visit      Cardiovascular and Mediastinum   Essential hypertension    Blood pressure controlled with diet off meds. Discussed that she should avoid NSAIDs which can raise her bp. Continue DASH diet.         Other   Morbid obesity (HCC)    Weight improved due to caloric restriction. Explained that the weight loss needs to be in a healthy way.       Muscle spasm of back    Continue voltaren gel and stretching        Relevant Medications   diclofenac sodium (VOLTAREN) 1 % GEL   Somatic symptom disorder - Primary    Referral placed for Psychiatry Discussed that she will need to get her depression and grief treated before we pursue the symptom of dysphagia      Complicated grieving    Appt with Psychiatry on 08/03/2018. Would recommend pt start there as she has numerous somatic symptoms that have their roots in her grieving episode.       Poor appetite    Discussed making nutritious food choices. She uses meal replacements like ensure to help with calories         Meds ordered this encounter  Medications  . diclofenac sodium (VOLTAREN) 1 % GEL    Sig: Apply 2 g topically 4 (four) times daily.    Dispense:  100 g    Refill:  1    Follow-up: Return in about 4 weeks (around 08/20/2018) for depression and hypertension .    Doristine BosworthZoe A Aizlyn Schifano, MD

## 2018-08-02 ENCOUNTER — Ambulatory Visit: Payer: No Typology Code available for payment source | Admitting: Family Medicine

## 2018-08-03 ENCOUNTER — Encounter (HOSPITAL_COMMUNITY): Payer: Self-pay | Admitting: Psychiatry

## 2018-08-03 ENCOUNTER — Ambulatory Visit (INDEPENDENT_AMBULATORY_CARE_PROVIDER_SITE_OTHER): Payer: No Typology Code available for payment source | Admitting: Psychiatry

## 2018-08-03 VITALS — BP 131/91 | HR 69 | Ht 59.0 in | Wt 192.0 lb

## 2018-08-03 DIAGNOSIS — F451 Undifferentiated somatoform disorder: Secondary | ICD-10-CM | POA: Diagnosis not present

## 2018-08-03 DIAGNOSIS — F411 Generalized anxiety disorder: Secondary | ICD-10-CM | POA: Insufficient documentation

## 2018-08-03 MED ORDER — MIRTAZAPINE 15 MG PO TABS: 15.0000 mg | ORAL_TABLET | Freq: Every day | ORAL | 0 refills | Status: DC

## 2018-08-03 MED FILL — MIRTAZAPINE 15 MG TABLET: 15 | 30 days supply | Qty: 30 | Fill #0

## 2018-08-03 NOTE — Progress Notes (Addendum)
Psychiatric Initial Adult Assessment   Patient Identification: Nancy Osborne MRN:  [id number] Date of Evaluation:  08/03/2018 Referral Source: Collie Siad MD Chief Complaint:  Poor sleep, fatigue after eating Visit Diagnosis:    ICD-10-CM   1. Somatic symptom disorder F45.1     History of Present Illness:  58 yo married female with recent onset of excessive worrying about her somatic sx which she described as fatigue after eating. She worries that something is wrong with her "gut" that has not been identified yet. She is aware that tests (blood) which have been run did not indicate any pathology (HgA1C elevated though). She is afraid to eat and described feeling tired, nauseated, bloated and "dizzy" soon after any meal. She has been drinking normally, has no problems with swallowing. She has lost weight since onset of these symptoms 4-5 months ago. She denies being under a lot of stress but stopped working for [employer] because of these symptoms. She denies feeling depressed. She falls asleep easily but wakes up at 3 AM each night. 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    Patient denies any prior psychiatric hx: no depression, no anxiety, no psychosis, no alcohol or substance abuse. She lost sister-in-law last year but denies being close to her and denies it was a major stress in her life. Job at [employer] was a more significant stressor. At this time she is not working anywhere. Patient has tried Lexapro then Zoloft for her sx but reports that anxiety worsened and Zoloft additionally made her feel paranoid. She is no longer taking either drug. She was also briefly on a low dose of Buspar 5 mg tid . Again she feels as though it worsened her anxiety. Sleep improved when she takes 25 mg of Benadryl but she was afraid it can lead to dependence so she stopped taking it.  She is overweight but lost weight: today is at 192 lbs vs 202 lbs documented in  early December 2019. Labs reviewed - hemoglobin A1c mildly elevated.   Associated Signs/Symptoms: Depression Symptoms:  fatigue, difficulty concentrating, anxiety, disturbed sleep, weight loss, decreased appetite, (Hypo) Manic Symptoms:  none Anxiety Symptoms:  Specific Phobias, fear of eating Psychotic Symptoms:  none PTSD Symptoms: Negative  Past Psychiatric History: see above  Previous Psychotropic Medications: Yes   Substance Abuse History in the last 12 months:  No.  Consequences of Substance Abuse: Negative  Past Medical History:  Past Medical History:  Diagnosis Date  . Hypertension     Past Surgical History:  Procedure Laterality Date  . c sections      Family Psychiatric History: denies any  Family History: No family history on file.  Social History:   Social History   Socioeconomic History  . Marital status: Married    Spouse name: Not on file  . Number of children: 3  . Years of education: Not on file  . Highest education level: Bachelor's degree (e.g., BA, AB, BS)  Occupational History  . Not on file  Social Needs  . Financial resource strain: Not on file  . Food insecurity:    Worry: Not on file    Inability: Not on file  . Transportation needs:    Medical: Not on file    Non-medical: Not on file  Tobacco Use  . Smoking status: Never Smoker  . Smokeless tobacco: Never Used  Substance and Sexual Activity  .  Alcohol use: No  . Drug use: No  . Sexual activity: Yes  Lifestyle  . Physical activity:    Days per week: Not on file    Minutes per session: Not on file  . Stress: Not on file  Relationships  . Social connections:    Talks on phone: Not on file    Gets together: Not on file    Attends religious service: Not on file    Active member of club or organization: Not on file    Attends meetings of clubs or organizations: Not on file    Relationship status: Not on file  Other Topics Concern  . Not on file  Social History  Narrative  . Not on file    Additional Social History: [place] is originally from [place]. Married, 3 children, youngest daughter lives with them.  Allergies:   Allergies  Allergen Reactions  . Sertraline Hcl Other (See Comments)    Anxiety and paranoia    Metabolic Disorder Labs: Lab Results  Component Value Date   HGBA1C 6.0 (A) 05/25/2018   No results found for: PROLACTIN Lab Results  Component Value Date   CHOL 212 (H) 09/15/2017   TRIG 51 09/15/2017   HDL 75 09/15/2017   CHOLHDL 2.8 09/15/2017   LDLCALC 127 (H) 09/15/2017   LDLCALC 131 (H) 10/21/2016   Lab Results  Component Value Date   TSH 0.708 07/10/2018    Therapeutic Level Labs: No results found for: LITHIUM No results found for: CBMZ No results found for: VALPROATE  Current Medications: Current Outpatient Medications  Medication Sig Dispense Refill  . diclofenac sodium (VOLTAREN) 1 % GEL Apply 2 g topically 4 (four) times daily. 100 g 1  . Multiple Vitamins-Minerals (MULTIVITAMIN ADULT EXTRA C PO) multivitamin    . mirtazapine (REMERON) 15 MG tablet Take 1 tablet (15 mg total) by mouth at bedtime for 30 days. 30 tablet 0   No current facility-administered medications for this visit.     Musculoskeletal: Strength & Muscle Tone: within normal limits Gait & Station: normal Patient leans: N/A  Psychiatric Specialty Exam: Review of Systems  Constitutional: Positive for malaise/fatigue and weight loss.  HENT: Negative.   Eyes: Negative.   Respiratory: Negative.   Cardiovascular: Negative.   Gastrointestinal: Positive for constipation and nausea.  Genitourinary: Negative.   Musculoskeletal: Negative.   Skin: Negative.   Neurological: Negative.   Endo/Heme/Allergies: Negative.   Psychiatric/Behavioral: The patient is nervous/anxious and has insomnia.     Blood pressure (!) 131/91, pulse 69, height 4\' 11"  (1.499 m), weight 192 lb (87.1 kg), last menstrual period 07/21/2011, SpO2 97 %.Body mass index  is 38.78 kg/m.  General Appearance: Casual and Well Groomed  Eye Contact:  Good  Speech:  Clear and Coherent  Volume:  Normal  Mood:  Anxious  Affect:  Full Range  Thought Process:  Goal Directed  Orientation:  Full (Time, Place, and Person)  Thought Content:  Logical and Obsessions with "bed"consequences of eating  Suicidal Thoughts:  No  Homicidal Thoughts:  No  Memory:  Immediate;   Good Recent;   Good Remote;   Good  Judgement:  Other:  questionable  Insight:  Shallow  Psychomotor Activity:  Normal  Concentration:  Concentration: Good  Recall:  Good  Fund of Knowledge:Fair  Language: Good  Akathisia:  Negative  Handed:  Right  AIMS (if indicated):  not done  Assets:  Communication Skills Desire for Improvement Housing  ADL's:  Intact  Cognition: WNL  Sleep:  Fair   Screenings: GAD-7     [facility]/2018 [facility]  Total GAD-7 Score  16    PHQ2-9     [facility]  PHQ-2 Total Score  0  0  2  0  0      Assessment and Plan: 58 yo married female with fear of eating, poor sleep, some weight loss, low energy. She reports that she "feels fine" until she tries to eat. Afterwards she feels tired, bloated, dizzy, sluggish. This makes her feel anxuious about eating and she has been restricting food intake and has lost some weight over past few months. These symptoms appeared  4-5 months ago and she does not associate them with any particular stressor. 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 There is no past psychiatric hx of depression, anxiety, psychosis, alcohol or drug abuse. There is no psychiatric hx in family either. Her anxiety worsened when she tried  Lexapro, Zoloft, Buspar. She is aware that her PCP did not find anything markedly off in labs so far. Patient is concerned that her sx are mostly restricted to GI and she has not had GU workup done so far. She said she will ask her PCP for a referral to GI doctor which will be very difficult to dissuade her from. She is convinced that the problem is "physical" although she does accept that some component of anxiety is present. In her view it is secondary to "what's wrong" with her abdominal problems.  Diagnostic impression: most likely dx is Somatic symptoms disorder, brief (less than 6 month duration).  Plan: Patient was reluctant to accept an suggestions regarding possible medications but eventually agreed to try mirtazapine 15 mg to help with sleep, anxiety, nausea. It is not likely to improve her energy and may worsen problems with constipation. The plan was discussed with patient and her youngest daughter. I spend 60 minutes in direct face to face clinical contact with the patient and devoted approximately 50% of this time to explanation of diagnosis, discussion of treatment options and med education. She will return to clinic on as needed basis - her choice - rather then have a scheduled follow up visit.   Magdalene Patricialgierd A Dreanna Kyllo, MD 2/11/202012:09 PM

## 2018-08-17 ENCOUNTER — Ambulatory Visit (INDEPENDENT_AMBULATORY_CARE_PROVIDER_SITE_OTHER): Payer: No Typology Code available for payment source | Admitting: Family Medicine

## 2018-08-17 ENCOUNTER — Other Ambulatory Visit: Payer: Self-pay

## 2018-08-17 ENCOUNTER — Encounter: Payer: Self-pay | Admitting: Family Medicine

## 2018-08-17 VITALS — BP 128/82 | HR 65 | Temp 98.6°F | Resp 20 | Ht <= 58 in | Wt 190.4 lb

## 2018-08-17 DIAGNOSIS — R634 Abnormal weight loss: Secondary | ICD-10-CM

## 2018-08-17 DIAGNOSIS — Z6839 Body mass index (BMI) 39.0-39.9, adult: Secondary | ICD-10-CM

## 2018-08-17 DIAGNOSIS — F4323 Adjustment disorder with mixed anxiety and depressed mood: Secondary | ICD-10-CM

## 2018-08-17 DIAGNOSIS — I10 Essential (primary) hypertension: Secondary | ICD-10-CM | POA: Diagnosis not present

## 2018-08-17 NOTE — Patient Instructions (Addendum)
Body mass index is 39.86 kg/m.  Wt Readings from Last 3 Encounters:  08/17/18 190 lb 6.4 oz (86.4 kg)  08/03/18 192 lb (87.1 kg)  07/23/18 192 lb 12.8 oz (87.5 kg)     If you have lab work done today you will be contacted with your lab results within the next 2 weeks.  If you have not heard from [place] then please contact us. The fastest way to get your results is to register for My Chart.   IF you received an x-ray today, you will receive an invoice from [facility]. Please contact [facility] at [phone] with questions or concerns regarding your invoice.   IF you received labwork today, you will receive an invoice from [place]. Please contact [facility] at [phone] with questions or concerns regarding your invoice.   Our billing staff will not be able to assist you with questions regarding bills from these companies.  You will be contacted with the lab results as soon as they are available. The fastest way to get your results is to activate your My Chart account. Instructions are located on the last page of this paperwork. If you have not heard from [place] regarding the results in 2 weeks, please contact this office.     Hypertension Hypertension, commonly called high blood pressure, is when the force of blood pumping through the arteries is too strong. The arteries are the blood vessels that carry blood from the heart throughout the body. Hypertension forces the heart to work harder to pump blood and may cause arteries to become narrow or stiff. Having untreated or uncontrolled hypertension can cause heart attacks, strokes, kidney disease, and other problems. A blood pressure reading consists of a higher number over a lower number. Ideally, your blood pressure should be below 120/80. The first ("top") number is called the systolic pressure. It is a measure of the pressure in your arteries as your heart beats. The second ("bottom") number is called the diastolic  pressure. It is a measure of the pressure in your arteries as the heart relaxes. What are the causes? The cause of this condition is not known. What increases the risk? Some risk factors for high blood pressure are under your control. Others are not. Factors you can change  Smoking.  Having type 2 diabetes mellitus, high cholesterol, or both.  Not getting enough exercise or physical activity.  Being overweight.  Having too much fat, sugar, calories, or salt (sodium) in your diet.  Drinking too much alcohol. Factors that are difficult or impossible to change  Having chronic kidney disease.  Having a family history of high blood pressure.  Age. Risk increases with age.  Race. You may be at higher risk if you are African-American.  Gender. Men are at higher risk than women before age 77. After age 73, women are at higher risk than men.  Having obstructive sleep apnea.  Stress. What are the signs or symptoms? Extremely high blood pressure (hypertensive crisis) may cause:  Headache.  Anxiety.  Shortness of breath.  Nosebleed.  Nausea and vomiting.  Severe chest pain.  Jerky movements you cannot control (seizures). How is this diagnosed? This condition is diagnosed by measuring your blood pressure while you are seated, with your arm resting on a surface. The cuff of the blood pressure monitor will be placed directly against the skin of your upper arm at the level of your heart. It should be measured at least twice using the same arm. Certain conditions can  cause a difference in blood pressure between your right and left arms. Certain factors can cause blood pressure readings to be lower or higher than normal (elevated) for a short period of time:  When your blood pressure is higher when you are in a health care provider's office than when you are at home, this is called white coat hypertension. Most people with this condition do not need medicines.  When your blood  pressure is higher at home than when you are in a health care provider's office, this is called masked hypertension. Most people with this condition may need medicines to control blood pressure. If you have a high blood pressure reading during one visit or you have normal blood pressure with other risk factors:  You may be asked to return on a different day to have your blood pressure checked again.  You may be asked to monitor your blood pressure at home for 1 week or longer. If you are diagnosed with hypertension, you may have other blood or imaging tests to help your health care provider understand your overall risk for other conditions. How is this treated? This condition is treated by making healthy lifestyle changes, such as eating healthy foods, exercising more, and reducing your alcohol intake. Your health care provider may prescribe medicine if lifestyle changes are not enough to get your blood pressure under control, and if:  Your systolic blood pressure is above 130.  Your diastolic blood pressure is above 80. Your personal target blood pressure may vary depending on your medical conditions, your age, and other factors. Follow these instructions at home: Eating and drinking   Eat a diet that is high in fiber and potassium, and low in sodium, added sugar, and fat. An example eating plan is called the DASH (Dietary Approaches to Stop Hypertension) diet. To eat this way: ? Eat plenty of fresh fruits and vegetables. Try to fill half of your plate at each meal with fruits and vegetables. ? Eat whole grains, such as whole wheat pasta, brown rice, or whole grain bread. Fill about one quarter of your plate with whole grains. ? Eat or drink low-fat dairy products, such as skim milk or low-fat yogurt. ? Avoid fatty cuts of meat, processed or cured meats, and poultry with skin. Fill about one quarter of your plate with lean proteins, such as fish, chicken without skin, beans, eggs, and  tofu. ? Avoid premade and processed foods. These tend to be higher in sodium, added sugar, and fat.  Reduce your daily sodium intake. Most people with hypertension should eat less than 1,500 mg of sodium a day.  Limit alcohol intake to no more than 1 drink a day for nonpregnant women and 2 drinks a day for men. One drink equals 12 oz of beer, 5 oz of wine, or 1 oz of hard liquor. Lifestyle   Work with your health care provider to maintain a healthy body weight or to lose weight. Ask what an ideal weight is for you.  Get at least 30 minutes of exercise that causes your heart to beat faster (aerobic exercise) most days of the week. Activities may include walking, swimming, or biking.  Include exercise to strengthen your muscles (resistance exercise), such as pilates or lifting weights, as part of your weekly exercise routine. Try to do these types of exercises for 30 minutes at least 3 days a week.  Do not use any products that contain nicotine or tobacco, such as cigarettes and e-cigarettes. If you  need help quitting, ask your health care provider.  Monitor your blood pressure at home as told by your health care provider.  Keep all follow-up visits as told by your health care provider. This is important. Medicines  Take over-the-counter and prescription medicines only as told by your health care provider. Follow directions carefully. Blood pressure medicines must be taken as prescribed.  Do not skip doses of blood pressure medicine. Doing this puts you at risk for problems and can make the medicine less effective.  Ask your health care provider about side effects or reactions to medicines that you should watch for. Contact a health care provider if:  You think you are having a reaction to a medicine you are taking.  You have headaches that keep coming back (recurring).  You feel dizzy.  You have swelling in your ankles.  You have trouble with your vision. Get help right away  if:  You develop a severe headache or confusion.  You have unusual weakness or numbness.  You feel faint.  You have severe pain in your chest or abdomen.  You vomit repeatedly.  You have trouble breathing. Summary  Hypertension is when the force of blood pumping through your arteries is too strong. If this condition is not controlled, it may put you at risk for serious complications.  Your personal target blood pressure may vary depending on your medical conditions, your age, and other factors. For most people, a normal blood pressure is less than 120/80.  Hypertension is treated with lifestyle changes, medicines, or a combination of both. Lifestyle changes include weight loss, eating a healthy, low-sodium diet, exercising more, and limiting alcohol. This information is not intended to replace advice given to you by your health care provider. Make sure you discuss any questions you have with your health care provider. Document Released: 06/09/2005 Document Revised: 05/07/2016 Document Reviewed: 05/07/2016 Elsevier Interactive Patient Education  2019 [employer].

## 2018-08-17 NOTE — Progress Notes (Signed)
Established Patient Office Visit  Subjective:  Patient ID: Nancy Osborne, female    DOB: 08-Jan-1961  Age: 58 y.o. MRN: [id number]  CC:  Chief Complaint  Patient presents with  . Hypertension    f/u  . Depression    screening done- score 2  . Anxiety    screening done- score 1    HPI Melissa Montane Simoni presents for  Hypertension: Patient here for follow-up of elevated blood pressure. She is exercising and is adherent to low salt diet.  Blood pressure is well controlled at home. Cardiac symptoms none. Patient denies chest pain, chest pressure/discomfort, claudication, dyspnea, exertional chest pressure/discomfort, irregular heart beat, lower extremity edema and near-syncope.  Cardiovascular risk factors: hypertension. Use of agents associated with hypertension: none. History of target organ damage: none. BP Readings from Last 3 Encounters:  08/17/18 128/82  08/03/18 (!) 131/91  07/23/18 (!) 138/92    Wt Readings from Last 3 Encounters:  08/17/18 190 lb 6.4 oz (86.4 kg)  08/03/18 192 lb (87.1 kg)  07/23/18 192 lb 12.8 oz (87.5 kg)    Anxiety and Depression She reports that she has been having less anxiety symptoms and is trying to do more She denies any suicidal thoughts or hallucinations She states that her family keep telling her that she should relax about some of her symptoms She states that Psychiatry told her that she did not have a major problem and that she could use mirtazapine for sleep if she could not sleep.   Depression screen [facility] 2/9 08/17/2018 07/23/2018 07/10/2018 05/25/2018 04/30/2018  Decreased Interest 0 0 0 2 0  Down, Depressed, Hopeless 0 0 0 0 0  PHQ - 2 Score 0 0 0 2 0  Altered sleeping 0 - - - -  Tired, decreased energy 1 - - - -  Change in appetite 0 - - - -  Feeling bad or failure about yourself  0 - - - -  Trouble concentrating 0 - - - -  Moving slowly or fidgety/restless 1 - - - -  Suicidal thoughts 0 - - - -  PHQ-9 Score 2 - - - -  Difficult doing  work/chores Not difficult at all - - - -   GAD 7 : Generalized Anxiety Score 08/17/2018 04/13/2018  Nervous, Anxious, on Edge 0 3  Control/stop worrying 0 3  Worry too much - different things 0 1  Trouble relaxing 0 3  Restless 1 1  Easily annoyed or irritable 0 2  Afraid - awful might happen 0 3  Total GAD 7 Score 1 16  Anxiety Difficulty Not difficult at all Not difficult at all     Past Medical History:  Diagnosis Date  . Hypertension     Past Surgical History:  Procedure Laterality Date  . c sections      History reviewed. No pertinent family history.  Social History   Socioeconomic History  . Marital status: Married    Spouse name: Not on file  . Number of children: 3  . Years of education: Not on file  . Highest education level: Bachelor's degree (e.g., BA, AB, BS)  Occupational History  . Not on file  Social Needs  . Financial resource strain: Not on file  . Food insecurity:    Worry: Not on file    Inability: Not on file  . Transportation needs:    Medical: Not on file    Non-medical: Not on file  Tobacco Use  .  Smoking status: Never Smoker  . Smokeless tobacco: Never Used  Substance and Sexual Activity  . Alcohol use: No  . Drug use: No  . Sexual activity: Yes  Lifestyle  . Physical activity:    Days per week: Not on file    Minutes per session: Not on file  . Stress: Not on file  Relationships  . Social connections:    Talks on phone: Not on file    Gets together: Not on file    Attends religious service: Not on file    Active member of club or organization: Not on file    Attends meetings of clubs or organizations: Not on file    Relationship status: Not on file  . Intimate partner violence:    Fear of current or ex partner: Not on file    Emotionally abused: Not on file    Physically abused: Not on file    Forced sexual activity: Not on file  Other Topics Concern  . Not on file  Social History Narrative  . Not on file     Outpatient Medications Prior to Visit  Medication Sig Dispense Refill  . diclofenac sodium (VOLTAREN) 1 % GEL Apply 2 g topically 4 (four) times daily. 100 g 1  . Multiple Vitamins-Minerals (MULTIVITAMIN ADULT EXTRA C PO) multivitamin    . mirtazapine (REMERON) 15 MG tablet Take 1 tablet (15 mg total) by mouth at bedtime for 30 days. (Patient not taking: Reported on 08/17/2018) 30 tablet 0   No facility-administered medications prior to visit.     Allergies  Allergen Reactions  . Sertraline Hcl Other (See Comments)    Anxiety and paranoia    ROS Review of Systems Review of Systems  Constitutional: Negative for activity change, appetite change, chills and fever.  HENT: Negative for congestion, nosebleeds, trouble swallowing and voice change.   Respiratory: Negative for cough, shortness of breath and wheezing.   Gastrointestinal: Negative for diarrhea, nausea and vomiting.  Genitourinary: Negative for difficulty urinating, dysuria, flank pain and hematuria.  Musculoskeletal: Negative for back pain, joint swelling and neck pain.  Neurological: Negative for dizziness, speech difficulty, light-headedness and numbness.  See HPI. All other review of systems negative.     Objective:    Physical Exam  BP 128/82   Pulse 65   Temp 98.6 F (37 C) (Oral)   Resp 20   Ht 4' 9.95" (1.472 m)   Wt 190 lb 6.4 oz (86.4 kg)   LMP 07/21/2011   SpO2 100%   BMI 39.86 kg/m  Wt Readings from Last 3 Encounters:  08/17/18 190 lb 6.4 oz (86.4 kg)  08/03/18 192 lb (87.1 kg)  07/23/18 192 lb 12.8 oz (87.5 kg)   Physical Exam  Constitutional: Oriented to person, place, and time. Appears well-developed and well-nourished.  HENT:  Head: Normocephalic and atraumatic.  Eyes: Conjunctivae and EOM are normal.  Cardiovascular: Normal rate, regular rhythm, normal heart sounds and intact distal pulses.  No murmur heard. Pulmonary/Chest: Effort normal and breath sounds normal. No stridor. No  respiratory distress. Has no wheezes.  Neurological: Is alert and oriented to person, place, and time.  Skin: Skin is warm. Capillary refill takes less than 2 seconds.  Psychiatric: Has a normal mood and affect. Behavior is normal. Judgment and thought content normal.    There are no preventive care reminders to display for this patient.  There are no preventive care reminders to display for this patient.  Lab Results  Component  Value Date   TSH 0.708 07/10/2018   Lab Results  Component Value Date   WBC 5.6 05/25/2018   HGB 13.7 (A) 05/25/2018   HCT 40.8 05/25/2018   MCV 81.8 05/25/2018   PLT 329 04/12/2018   Lab Results  Component Value Date   NA 143 07/10/2018   K 3.9 07/10/2018   CO2 25 07/10/2018   GLUCOSE 113 (H) 07/10/2018   BUN 7 07/10/2018   CREATININE 1.05 (H) 07/10/2018   BILITOT 0.3 07/10/2018   ALKPHOS 89 07/10/2018   AST 16 07/10/2018   ALT 14 07/10/2018   PROT 7.2 07/10/2018   ALBUMIN 4.3 07/10/2018   CALCIUM 9.7 07/10/2018   ANIONGAP 11 04/12/2018   Lab Results  Component Value Date   CHOL 212 (H) 09/15/2017   Lab Results  Component Value Date   HDL 75 09/15/2017   Lab Results  Component Value Date   LDLCALC 127 (H) 09/15/2017   Lab Results  Component Value Date   TRIG 51 09/15/2017   Lab Results  Component Value Date   CHOLHDL 2.8 09/15/2017   Lab Results  Component Value Date   HGBA1C 6.0 (A) 05/25/2018      Assessment & Plan:   Problem List Items Addressed This Visit      Cardiovascular and Mediastinum   Essential hypertension - Primary Patient's blood pressure is at goal of 139/89 or less. Condition is stable. Continue current medications and treatment plan. I recommend that you exercise for 30-45 minutes 5 days a week. I also recommend a balanced diet with fruits and vegetables every day, lean meats, and little fried foods. The DASH diet (you can find this online) is a good example of this.     Other Visit Diagnoses     Class 2 severe obesity due to excess calories with serious comorbidity and body mass index (BMI) of 39.0 to 39.9 in adult [facility])     -   Improving with eating smaller meals   Loss of weight    -  Continues to lose weight but in a healthy way    Anxiety and Depression -  Improving moods and improving somatization  No orders of the defined types were placed in this encounter.   Follow-up: Return in about 3 months (around 11/15/2018) for hypertension follow up .    Doristine Bosworth, MD

## 2018-11-19 ENCOUNTER — Encounter: Payer: Self-pay | Admitting: Family Medicine

## 2018-11-19 ENCOUNTER — Other Ambulatory Visit: Payer: Self-pay

## 2018-11-19 ENCOUNTER — Ambulatory Visit (INDEPENDENT_AMBULATORY_CARE_PROVIDER_SITE_OTHER): Payer: No Typology Code available for payment source | Admitting: Family Medicine

## 2018-11-19 VITALS — BP 156/100 | HR 65 | Temp 99.0°F | Resp 17 | Ht <= 58 in | Wt 188.8 lb

## 2018-11-19 DIAGNOSIS — R7309 Other abnormal glucose: Secondary | ICD-10-CM

## 2018-11-19 DIAGNOSIS — R140 Abdominal distension (gaseous): Secondary | ICD-10-CM

## 2018-11-19 DIAGNOSIS — I10 Essential (primary) hypertension: Secondary | ICD-10-CM

## 2018-11-19 DIAGNOSIS — K5900 Constipation, unspecified: Secondary | ICD-10-CM | POA: Diagnosis not present

## 2018-11-19 DIAGNOSIS — K5909 Other constipation: Secondary | ICD-10-CM

## 2018-11-19 MED ORDER — AMLODIPINE BESYLATE 10 MG PO TABS: 5.0000 mg | ORAL_TABLET | Freq: Every day | ORAL | Status: DC

## 2018-11-19 NOTE — Patient Instructions (Addendum)
     If you have lab work done today you will be contacted with your lab results within the next 2 weeks.  If you have not heard from us then please contact us. The fastest way to get your results is to register for My Chart.   IF you received an x-ray today, you will receive an invoice from [facility]. Please contact [facility] at [phone] with questions or concerns regarding your invoice.   IF you received labwork today, you will receive an invoice from [facility]. Please contact [facility] at [phone] with questions or concerns regarding your invoice.   Our billing staff will not be able to assist you with questions regarding bills from these companies.  You will be contacted with the lab results as soon as they are available. The fastest way to get your results is to activate your My Chart account. Instructions are located on the last page of this paperwork. If you have not heard from us regarding the results in 2 weeks, please contact this office.     Constipation, Adult Constipation is when a person has fewer bowel movements in a week than normal, has difficulty having a bowel movement, or has stools that are dry, hard, or larger than normal. Constipation may be caused by an underlying condition. It may become worse with age if a person takes certain medicines and does not take in enough fluids. Follow these instructions at home: Eating and drinking   Eat foods that have a lot of fiber, such as fresh fruits and vegetables, whole grains, and beans.  Limit foods that are high in fat, low in fiber, or overly processed, such as french fries, hamburgers, cookies, candies, and soda.  Drink enough fluid to keep your urine clear or pale yellow. General instructions  Exercise regularly or as told by your health care provider.  Go to the restroom when you have the urge to go. Do not hold it in.  Take over-the-counter and prescription medicines only as told by your  health care provider. These include any fiber supplements.  Practice pelvic floor retraining exercises, such as deep breathing while relaxing the lower abdomen and pelvic floor relaxation during bowel movements.  Watch your condition for any changes.  Keep all follow-up visits as told by your health care provider. This is important. Contact a health care provider if:  You have pain that gets worse.  You have a fever.  You do not have a bowel movement after 4 days.  You vomit.  You are not hungry.  You lose weight.  You are bleeding from the anus.  You have thin, pencil-like stools. Get help right away if:  You have a fever and your symptoms suddenly get worse.  You leak stool or have blood in your stool.  Your abdomen is bloated.  You have severe pain in your abdomen.  You feel dizzy or you faint. This information is not intended to replace advice given to you by your health care provider. Make sure you discuss any questions you have with your health care provider. Document Released: 03/07/2004 Document Revised: 12/28/2015 Document Reviewed: 11/28/2015 Elsevier Interactive Patient Education  2019 [employer]  

## 2018-11-19 NOTE — Progress Notes (Signed)
Established Patient Office Visit  Subjective:  Patient ID: Nancy Osborne, female    DOB: 1960/09/24  Age: 58 y.o. MRN: [id number]  CC:  Chief Complaint  Patient presents with  . hypertension f/u    HPI Nancy Osborne presents for  Constipation She is taking x-lax once every other week as needed for constipation She gets bloating She reports that her constipation is also associated with chest pressure She states that when she does daily activity her chest gets pressure again Typically this resolves after taking aspirin She is mostly at home due to the coronavirus   Hypertension: Patient here for follow-up of elevated blood pressure. She is not exercising and is adherent to low salt diet.  Blood pressure is well controlled at home. Cardiac symptoms none. Patient denies chest pain, chest pressure/discomfort, dyspnea and fatigue.    BP Readings from Last 3 Encounters:  11/19/18 (!) 156/100  08/17/18 128/82  07/23/18 (!) 138/92   She denies lower extremity edema She is exercising but not when she is constipated due to discomfort She can only eat a small amount of blueberries and oatmeal and then she might have some crackers and peanut butter.    Wt Readings from Last 3 Encounters:  11/19/18 188 lb 12.8 oz (85.6 kg)  08/17/18 190 lb 6.4 oz (86.4 kg)  07/23/18 192 lb 12.8 oz (87.5 kg)   Somatic Disorder She reports that her sleep is much better She does not need the mirtazapine at bedtime for sleep She only took 2 doses of the medication Depression screen [facility] 2/9 11/19/2018 08/17/2018 07/23/2018 07/10/2018 05/25/2018  Decreased Interest 0 0 0 0 2  Down, Depressed, Hopeless 0 0 0 0 0  PHQ - 2 Score 0 0 0 0 2  Altered sleeping - 0 - - -  Tired, decreased energy - 1 - - -  Change in appetite - 0 - - -  Feeling bad or failure about yourself  - 0 - - -  Trouble concentrating - 0 - - -  Moving slowly or fidgety/restless - 1 - - -  Suicidal thoughts - 0 - - -  PHQ-9 Score - 2 - - -   Difficult doing work/chores - Not difficult at all - - -     Past Medical History:  Diagnosis Date  . Hypertension     Past Surgical History:  Procedure Laterality Date  . c sections      No family history on file.  Social History   Socioeconomic History  . Marital status: Married    Spouse name: Not on file  . Number of children: 3  . Years of education: Not on file  . Highest education level: Bachelor's degree (e.g., BA, AB, BS)  Occupational History  . Not on file  Social Needs  . Financial resource strain: Not on file  . Food insecurity:    Worry: Not on file    Inability: Not on file  . Transportation needs:    Medical: Not on file    Non-medical: Not on file  Tobacco Use  . Smoking status: Never Smoker  . Smokeless tobacco: Never Used  Substance and Sexual Activity  . Alcohol use: No  . Drug use: No  . Sexual activity: Yes  Lifestyle  . Physical activity:    Days per week: Not on file    Minutes per session: Not on file  . Stress: Not on file  Relationships  . Social connections:  Talks on phone: Not on file    Gets together: Not on file    Attends religious service: Not on file    Active member of club or organization: Not on file    Attends meetings of clubs or organizations: Not on file    Relationship status: Not on file  . Intimate partner violence:    Fear of current or ex partner: Not on file    Emotionally abused: Not on file    Physically abused: Not on file    Forced sexual activity: Not on file  Other Topics Concern  . Not on file  Social History Narrative  . Not on file    Outpatient Medications Prior to Visit  Medication Sig Dispense Refill  . Multiple Vitamins-Minerals (MULTIVITAMIN ADULT EXTRA C PO) multivitamin    . diclofenac sodium (VOLTAREN) 1 % GEL Apply 2 g topically 4 (four) times daily. (Patient not taking: Reported on 11/19/2018) 100 g 1  . mirtazapine (REMERON) 15 MG tablet Take 1 tablet (15 mg total) by mouth at  bedtime for 30 days. (Patient not taking: Reported on 08/17/2018) 30 tablet 0   No facility-administered medications prior to visit.     Allergies  Allergen Reactions  . Sertraline Hcl Other (See Comments)    Anxiety and paranoia    ROS Review of Systems Review of Systems  Constitutional: Negative for activity change, appetite change, chills and fever.  HENT: Negative for congestion, nosebleeds, trouble swallowing and voice change.   Respiratory: Negative for cough, shortness of breath and wheezing.   Gastrointestinal: see hpi Genitourinary: Negative for difficulty urinating, dysuria, flank pain and hematuria.  Musculoskeletal: Negative for back pain, joint swelling and neck pain.  Neurological: Negative for dizziness, speech difficulty, light-headedness and numbness.  See HPI. All other review of systems negative.     Objective:    Physical Exam  BP (!) 156/100 (BP Location: Left Arm, Patient Position: Sitting, Cuff Size: Large)   Pulse 65   Temp 99 F (37.2 C) (Oral)   Resp 17   Ht 4' 9.95" (1.472 m)   Wt 188 lb 12.8 oz (85.6 kg)   LMP 07/21/2011   SpO2 99%   BMI 39.53 kg/m  Wt Readings from Last 3 Encounters:  11/19/18 188 lb 12.8 oz (85.6 kg)  08/17/18 190 lb 6.4 oz (86.4 kg)  07/23/18 192 lb 12.8 oz (87.5 kg)    Physical Exam  Constitutional: Oriented to person, place, and time. Appears well-developed and well-nourished.  HENT:  Head: Normocephalic and atraumatic.  Eyes: Conjunctivae and EOM are normal.  Cardiovascular: Normal rate, regular rhythm, normal heart sounds and intact distal pulses.  No murmur heard. Pulmonary/Chest: Effort normal and breath sounds normal. No stridor. No respiratory distress. Has no wheezes.  Abdomen: obese, nondistended, normoactive bs, soft, nontender Neurological: Is alert and oriented to person, place, and time.  Skin: Skin is warm. Capillary refill takes less than 2 seconds.  Psychiatric: Has a normal mood and affect.  Behavior is normal. Judgment and thought content normal.   There are no preventive care reminders to display for this patient.  There are no preventive care reminders to display for this patient.  Lab Results  Component Value Date   TSH 0.708 07/10/2018   Lab Results  Component Value Date   WBC 5.6 05/25/2018   HGB 13.7 (A) 05/25/2018   HCT 40.8 05/25/2018   MCV 81.8 05/25/2018   PLT 329 04/12/2018   Lab Results  Component Value  Date   NA 143 07/10/2018   K 3.9 07/10/2018   CO2 25 07/10/2018   GLUCOSE 113 (H) 07/10/2018   BUN 7 07/10/2018   CREATININE 1.05 (H) 07/10/2018   BILITOT 0.3 07/10/2018   ALKPHOS 89 07/10/2018   AST 16 07/10/2018   ALT 14 07/10/2018   PROT 7.2 07/10/2018   ALBUMIN 4.3 07/10/2018   CALCIUM 9.7 07/10/2018   ANIONGAP 11 04/12/2018   Lab Results  Component Value Date   CHOL 212 (H) 09/15/2017   Lab Results  Component Value Date   HDL 75 09/15/2017   Lab Results  Component Value Date   LDLCALC 127 (H) 09/15/2017   Lab Results  Component Value Date   TRIG 51 09/15/2017   Lab Results  Component Value Date   CHOLHDL 2.8 09/15/2017   Lab Results  Component Value Date   HGBA1C 6.0 (A) 05/25/2018      Assessment & Plan:   Problem List Items Addressed This Visit      Cardiovascular and Mediastinum   Essential hypertension - Primary Discussed mgmt and will add back 1/2 dose of amlodipine at 69m and titrate up Will also check lipids   Relevant Medications   amLODipine (NORVASC) 10 MG tablet   Other Relevant Orders   Lipid panel     Other   Elevated hemoglobin A1c  - will recheck, pt is in prediabetic range   Relevant Orders   CMP14+EGFR   Hemoglobin A1c    Other Visit Diagnoses    Obstipation    -  Discussed referral to GI for mgmt   Relevant Orders   Ambulatory referral to Gastroenterology   Bloating       Relevant Orders   Ambulatory referral to Gastroenterology   Other constipation       Relevant Orders    Ambulatory referral to Gastroenterology      Meds ordered this encounter  Medications  . amLODipine (NORVASC) 10 MG tablet    Sig: Take 0.5 tablets (5 mg total) by mouth daily.    Follow-up: Return in about 6 weeks (around 12/31/2018) for blood pressure follow up .    ZForrest Moron MD

## 2018-11-20 LAB — HEMOGLOBIN A1C
Est. average glucose Bld gHb Est-mCnc: 120 mg/dL
Hgb A1c MFr Bld: 5.8 % — ABNORMAL HIGH (ref 4.8–5.6)

## 2018-11-20 LAB — LIPID PANEL
Chol/HDL Ratio: 3 ratio (ref 0.0–4.4)
Cholesterol, Total: 233 mg/dL — ABNORMAL HIGH (ref 100–199)
HDL: 78 mg/dL (ref >39)
LDL Calculated: 146 mg/dL — ABNORMAL HIGH (ref 0–99)
Triglycerides: 47 mg/dL (ref 0–149)
VLDL Cholesterol Cal: 9 mg/dL (ref 5–40)

## 2018-11-20 LAB — CMP14+EGFR
ALT: 17 IU/L (ref 0–32)
AST: 22 IU/L (ref 0–40)
Albumin/Globulin Ratio: 1.3 (ref 1.2–2.2)
Albumin: 4.3 g/dL (ref 3.8–4.9)
Alkaline Phosphatase: 111 IU/L (ref 39–117)
BUN/Creatinine Ratio: 15 (ref 9–23)
BUN: 15 mg/dL (ref 6–24)
Bilirubin Total: 0.4 mg/dL (ref 0.0–1.2)
CO2: 26 mmol/L (ref 20–29)
Calcium: 9.9 mg/dL (ref 8.7–10.2)
Chloride: 100 mmol/L (ref 96–106)
Creatinine, Ser: 0.97 mg/dL (ref 0.57–1.00)
GFR calc Af Amer: 74 mL/min/{1.73_m2} (ref >59)
GFR calc non Af Amer: 65 mL/min/{1.73_m2} (ref >59)
Globulin, Total: 3.2 g/dL (ref 1.5–4.5)
Glucose: 98 mg/dL (ref 65–99)
Potassium: 4.4 mmol/L (ref 3.5–5.2)
Sodium: 142 mmol/L (ref 134–144)
Total Protein: 7.5 g/dL (ref 6.0–8.5)

## 2019-01-03 ENCOUNTER — Encounter: Payer: Self-pay | Admitting: Family Medicine

## 2019-01-03 ENCOUNTER — Ambulatory Visit (INDEPENDENT_AMBULATORY_CARE_PROVIDER_SITE_OTHER): Payer: No Typology Code available for payment source | Admitting: Family Medicine

## 2019-01-03 ENCOUNTER — Other Ambulatory Visit: Payer: Self-pay

## 2019-01-03 VITALS — BP 142/82 | HR 75 | Temp 98.3°F | Resp 18 | Ht <= 58 in | Wt 184.6 lb

## 2019-01-03 DIAGNOSIS — R630 Anorexia: Secondary | ICD-10-CM | POA: Diagnosis not present

## 2019-01-03 DIAGNOSIS — I10 Essential (primary) hypertension: Secondary | ICD-10-CM

## 2019-01-03 DIAGNOSIS — R140 Abdominal distension (gaseous): Secondary | ICD-10-CM

## 2019-01-03 NOTE — Progress Notes (Signed)
Established Patient Office Visit  Subjective:  Patient ID: Nancy Osborne, female    DOB: Aug 16, 1960  Age: 58 y.o. MRN: [id number]  CC:  Chief Complaint  Patient presents with  . Hypertension    follow up on her bp; states she takes her bp everyday  . body issues    states she has been having some issues with her throat and just not feeling well    HPI [name] presents for   Lethargy and Feeling Sick Patient reports that she has been having symptoms of chest heaviness and palpitations She states that she had pain in her chest but it was on the right side She states that she has been feeling    She reports that she also feels like she has a lot of bloating and had not been to the gastroenterologist yet She is able to eat but still feels low energy and weak  Depression screen [facility] 2/9 01/03/2019 11/19/2018 08/17/2018 07/23/2018 07/10/2018  Decreased Interest 0 0 0 0 0  Down, Depressed, Hopeless 0 0 0 0 0  PHQ - 2 Score 0 0 0 0 0  Altered sleeping - - 0 - -  Tired, decreased energy - - 1 - -  Change in appetite - - 0 - -  Feeling bad or failure about yourself  - - 0 - -  Trouble concentrating - - 0 - -  Moving slowly or fidgety/restless - - 1 - -  Suicidal thoughts - - 0 - -  PHQ-9 Score - - 2 - -  Difficult doing work/chores - - Not difficult at all - -     Hypertension: Patient here for follow-up of elevated blood pressure. She is exercising and is adherent to low salt diet.  Blood pressure is well controlled at home. Cardiac symptoms palpitations. Patient denies chest pain, chest pressure/discomfort, claudication, dyspnea and exertional chest pressure/discomfort.  Cardiovascular risk factors: hypertension and obesity (BMI >= 30 kg/m2). Use of agents associated with hypertension: none. History of target organ damage: none.  BP Readings from Last 3 Encounters:  01/03/19 (!) 170/96  11/19/18 (!) 156/100  08/17/18 128/82   Obesity She reports that she exercising and walks  the dog She is not working and she is also cleaning  She stays home and notices that she gets lethargic after eating Body mass index is 39.95 kg/m. Wt Readings from Last 3 Encounters:  01/03/19 184 lb 9.6 oz (83.7 kg)  11/19/18 188 lb 12.8 oz (85.6 kg)  08/17/18 190 lb 6.4 oz (86.4 kg)     Past Medical History:  Diagnosis Date  . Hypertension     Past Surgical History:  Procedure Laterality Date  . c sections      No family history on file.  Social History   Socioeconomic History  . Marital status: Married    Spouse name: Not on file  . Number of children: 3  . Years of education: Not on file  . Highest education level: Bachelor's degree (e.g., BA, AB, BS)  Occupational History  . Not on file  Social Needs  . Financial resource strain: Not on file  . Food insecurity    Worry: Not on file    Inability: Not on file  . Transportation needs    Medical: Not on file    Non-medical: Not on file  Tobacco Use  . Smoking status: Never Smoker  . Smokeless tobacco: Never Used  Substance and Sexual Activity  .  Alcohol use: No  . Drug use: No  . Sexual activity: Yes  Lifestyle  . Physical activity    Days per week: Not on file    Minutes per session: Not on file  . Stress: Not on file  Relationships  . Social Musicianconnections    Talks on phone: Not on file    Gets together: Not on file    Attends religious service: Not on file    Active member of club or organization: Not on file    Attends meetings of clubs or organizations: Not on file    Relationship status: Not on file  . Intimate partner violence    Fear of current or ex partner: Not on file    Emotionally abused: Not on file    Physically abused: Not on file    Forced sexual activity: Not on file  Other Topics Concern  . Not on file  Social History Narrative  . Not on file    Outpatient Medications Prior to Visit  Medication Sig Dispense Refill  . Multiple Vitamins-Minerals (MULTIVITAMIN ADULT EXTRA C PO)  multivitamin    . amLODipine (NORVASC) 10 MG tablet Take 0.5 tablets (5 mg total) by mouth daily.    . mirtazapine (REMERON) 15 MG tablet Take 1 tablet (15 mg total) by mouth at bedtime for 30 days. (Patient not taking: Reported on 08/17/2018) 30 tablet 0  . diclofenac sodium (VOLTAREN) 1 % GEL Apply 2 g topically 4 (four) times daily. (Patient not taking: Reported on 11/19/2018) 100 g 1   No facility-administered medications prior to visit.     Allergies  Allergen Reactions  . Sertraline Hcl Other (See Comments)    Anxiety and paranoia    ROS Review of Systems See hpi   Objective:    Physical Exam  BP (!) 170/96 (BP Location: Right Arm, Patient Position: Sitting, Cuff Size: Large)   Pulse 75   Temp 98.3 F (36.8 C) (Oral)   Resp 18   Ht 4\' 9"  (1.448 m)   Wt 184 lb 9.6 oz (83.7 kg)   LMP 07/21/2011   SpO2 98%   BMI 39.95 kg/m  Wt Readings from Last 3 Encounters:  01/03/19 184 lb 9.6 oz (83.7 kg)  11/19/18 188 lb 12.8 oz (85.6 kg)  08/17/18 190 lb 6.4 oz (86.4 kg)   Physical Exam  Constitutional: Oriented to person, place, and time. Appears well-developed and well-nourished.  HENT:  Head: Normocephalic and atraumatic.  Eyes: Conjunctivae and EOM are normal.  Cardiovascular: Normal rate, regular rhythm, normal heart sounds and intact distal pulses.  No murmur heard. Pulmonary/Chest: Effort normal and breath sounds normal. No stridor. No respiratory distress. Has no wheezes.  Neurological: Is alert and oriented to person, place, and time.  Skin: Skin is warm. Capillary refill takes less than 2 seconds.  Psychiatric: Has a normal mood and affect. Behavior is normal. Judgment and thought content normal.    There are no preventive care reminders to display for this patient.  There are no preventive care reminders to display for this patient.  Lab Results  Component Value Date   TSH 0.708 07/10/2018   Lab Results  Component Value Date   WBC 5.6 05/25/2018   HGB  13.7 (A) 05/25/2018   HCT 40.8 05/25/2018   MCV 81.8 05/25/2018   PLT 329 04/12/2018   Lab Results  Component Value Date   NA 142 11/19/2018   K 4.4 11/19/2018   CO2 26 11/19/2018  GLUCOSE 98 11/19/2018   BUN 15 11/19/2018   CREATININE 0.97 11/19/2018   BILITOT 0.4 11/19/2018   ALKPHOS 111 11/19/2018   AST 22 11/19/2018   ALT 17 11/19/2018   PROT 7.5 11/19/2018   ALBUMIN 4.3 11/19/2018   CALCIUM 9.9 11/19/2018   ANIONGAP 11 04/12/2018   Lab Results  Component Value Date   CHOL 233 (H) 11/19/2018   Lab Results  Component Value Date   HDL 78 11/19/2018   Lab Results  Component Value Date   LDLCALC 146 (H) 11/19/2018   Lab Results  Component Value Date   TRIG 47 11/19/2018   Lab Results  Component Value Date   CHOLHDL 3.0 11/19/2018   Lab Results  Component Value Date   HGBA1C 5.8 (H) 11/19/2018      Assessment & Plan:   Problem List Items Addressed This Visit      Cardiovascular and Mediastinum   Essential hypertension     Other   Poor appetite    Other Visit Diagnoses    Bloating    -  Primary     Advised pt to continue eating small frequent meals Reviewed all her previous labs, ecg and tests fro m10/2019 to present Gave her phone number for gastroenterology  She asked about starting a probiotic - encouraged patient to do so   No orders of the defined types were placed in this encounter.   Follow-up: No follow-ups on file.    Doristine BosworthZoe A Stallings, MD

## 2019-01-03 NOTE — Patient Instructions (Addendum)
  Dr. Max Sane Address: [address], [place], [place] [address] Phone: [phone]   If you have lab work done today you will be contacted with your lab results within the next 2 weeks.  If you have not heard from [place] then please contact us. The fastest way to get your results is to register for My Chart.   IF you received an x-ray today, you will receive an invoice from [facility]. Please contact [facility] at [phone] with questions or concerns regarding your invoice.   IF you received labwork today, you will receive an invoice from [facility]. Please contact [facility] at [phone] with questions or concerns regarding your invoice.   Our billing staff will not be able to assist you with questions regarding bills from these companies.  You will be contacted with the lab results as soon as they are available. The fastest way to get your results is to activate your My Chart account. Instructions are located on the last page of this paperwork. If you have not heard from [place] regarding the results in 2 weeks, please contact this office.

## 2019-01-28 MED FILL — PANTOPRAZOLE SOD DR 40 MG T: 40 | 90 days supply | Qty: 90 | Fill #0

## 2019-02-17 NOTE — Progress Notes (Deleted)
Cardiology Office Note:   Date:  02/17/2019  NAME:  Nancy Osborne    MRN: [id number] DOB:  02/09/1961   PCP:  Forrest Moron, MD  Cardiologist:  Evalina Field, MD  Electrophysiologist:  None   Referring MD: Juanita Craver, MD   No chief complaint on file. ***   History of Present Illness:   Nancy Osborne is a 58 y.o. female with a hx of hypertension, obesity, GERD who is being seen today for the evaluation of chest pain at the request of Juanita Craver, MD.  Past Medical History: Past Medical History:  Diagnosis Date  . Hypertension     Past Surgical History: Past Surgical History:  Procedure Laterality Date  . c sections      Current Medications: No outpatient medications have been marked as taking for the 02/18/19 encounter (Appointment) with O'Neal, Cassie Freer, MD.     Allergies:    Sertraline hcl   Social History: Social History   Socioeconomic History  . Marital status: Married    Spouse name: Not on file  . Number of children: 3  . Years of education: Not on file  . Highest education level: Bachelor's degree (e.g., BA, AB, BS)  Occupational History  . Not on file  Social Needs  . Financial resource strain: Not on file  . Food insecurity    Worry: Not on file    Inability: Not on file  . Transportation needs    Medical: Not on file    Non-medical: Not on file  Tobacco Use  . Smoking status: Never Smoker  . Smokeless tobacco: Never Used  Substance and Sexual Activity  . Alcohol use: No  . Drug use: No  . Sexual activity: Yes  Lifestyle  . Physical activity    Days per week: Not on file    Minutes per session: Not on file  . Stress: Not on file  Relationships  . Social Herbalist on phone: Not on file    Gets together: Not on file    Attends religious service: Not on file    Active member of club or organization: Not on file    Attends meetings of clubs or organizations: Not on file    Relationship status: Not on file  Other  Topics Concern  . Not on file  Social History Narrative  . Not on file     Family History: The patient's ***family history is not on file.  ROS:   All other ROS reviewed and negative. Pertinent positives noted in the HPI.     EKGs/Labs/Other Studies Reviewed:   The following studies were personally reviewed by me today:  EKG:  EKG is *** ordered today.  The ekg ordered today demonstrates ***, and was personally reviewed by me.   Recent Labs: 04/12/2018: Platelets 329 05/25/2018: Hemoglobin 13.7 07/10/2018: TSH 0.708 11/19/2018: ALT 17; BUN 15; Creatinine, Ser 0.97; Potassium 4.4; Sodium 142   Recent Lipid Panel    Component Value Date/Time   CHOL 233 (H) 11/19/2018 1542   TRIG 47 11/19/2018 1542   HDL 78 11/19/2018 1542   CHOLHDL 3.0 11/19/2018 1542   LDLCALC 146 (H) 11/19/2018 1542    Physical Exam:   VS:  LMP 07/21/2011    Wt Readings from Last 3 Encounters:  01/03/19 184 lb 9.6 oz (83.7 kg)  11/19/18 188 lb 12.8 oz (85.6 kg)  08/17/18 190 lb 6.4 oz (86.4 kg)    General: Well  nourished, well developed, in no acute distress Heart: Atraumatic, normal size  Eyes: PEERLA, EOMI  Neck: Supple, no JVD Endocrine: No thryomegaly Cardiac: Normal S1, S2; RRR; no murmurs, rubs, or gallops Lungs: Clear to auscultation bilaterally, no wheezing, rhonchi or rales  Abd: Soft, nontender, no hepatomegaly  Ext: No edema, pulses 2+ Musculoskeletal: No deformities, BUE and BLE strength normal and equal Skin: Warm and dry, no rashes   Neuro: Alert and oriented to person, place, time, and situation, CNII-XII grossly intact, no focal deficits  Psych: Normal mood and affect   ASSESSMENT:   NAME@ is a 58 y.o. female who presents for the following: No diagnosis found.  PLAN:   There are no diagnoses linked to this encounter.  Disposition: No follow-ups on file.  Medication Adjustments/Labs and Tests Ordered: Current medicines are reviewed at length with the patient today.  Concerns  regarding medicines are outlined above.  No orders of the defined types were placed in this encounter.  No orders of the defined types were placed in this encounter.   There are no Patient Instructions on file for this visit.   Signed, Lenna GilfordWesley T. Flora Lipps'Neal, MD [facility]  [facility]  [address], [address] [place], [place] [address] [phone]  02/17/2019 6:04 PM

## 2019-02-18 ENCOUNTER — Ambulatory Visit: Payer: No Typology Code available for payment source | Admitting: Cardiovascular Disease

## 2019-03-01 ENCOUNTER — Other Ambulatory Visit: Payer: Self-pay | Admitting: Family Medicine

## 2019-03-01 MED ORDER — AMLODIPINE BESYLATE 10 MG PO TABS: 5.0000 mg | ORAL_TABLET | Freq: Every day | ORAL | Status: DC

## 2019-03-01 NOTE — Telephone Encounter (Signed)
Medication Refill - Medication: amLODipine (NORVASC) 10 MG tablet  Has the patient contacted their pharmacy? Yes - pt is completely out of medication (Agent: If no, request that the patient contact the pharmacy for the refill.) (Agent: If yes, when and what did the pharmacy advise?)  Preferred Pharmacy (with phone number or street name):  [place], [place] - [address]. [phone] (Phone) [phone] (Fax)   Agent: Please be advised that RX refills may take up to 3 business days. We ask that you follow-up with your pharmacy.

## 2019-03-29 NOTE — Progress Notes (Signed)
Cardiology Office Note:   Date:  03/30/2019  NAME:  Nancy Osborne    MRN: [id number] DOB:  [birth year]   PCP:  Doristine Bosworth, MD  Cardiologist:  Reatha Harps, MD  Electrophysiologist:  None   Referring MD: Charna Elizabeth, MD   Chief Complaint  Patient presents with  . Shortness of Breath   History of Present Illness:   Nancy Osborne is a 58 y.o. female with a hx of hypertension, chronic constipation, GERD who is being seen today for the evaluation of chest pain at the request of Collie Siad A, MD.  She presents for evaluation of 3 to 4 months of intermittent nausea, and chest pain in the mornings.  She apparently has been evaluated by gastroenterology, and started on a PPI.  She reports she still gets symptoms, and has the symptoms that are concerning to her.  Her gastroenterologist was also concerned that she need to be evaluated from a cardiac standpoint.  She reports a longstanding history of hypertension, that was diagnosed in the setting of losing several family members.  She is not diabetic.  But does have hyperlipidemia on review of her laboratory data.  She reports she does not routinely exercise with cardiovascular activities, but does do some strength training at home.  She gets her symptoms of nausea and chest pain, daily.  They seem to last about 10 minutes in the morning.  Associated symptoms have included shortness of breath in the past.  She describes no symptoms of orthopnea, PND or lower extremity edema.  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  Other cardiovascular disease risk factors include obesity.  She is a never smoker, and does not appear to have a strong history of heart disease in the family.  Past Medical History: Past Medical History:  Diagnosis Date  . Hypertension     Past Surgical History: Past Surgical History:  Procedure Laterality Date  . c sections      Current Medications: Current Meds   Medication Sig  . amLODipine (NORVASC) 10 MG tablet Take 0.5 tablets (5 mg total) by mouth daily.  . Multiple Vitamins-Minerals (MULTIVITAMIN ADULT EXTRA C PO) multivitamin     Allergies:    Sertraline hcl   Social History: Social History   Socioeconomic History  . Marital status: Married    Spouse name: Not on file  . Number of children: 3  . Years of education: Not on file  . Highest education level: Bachelor's degree (e.g., BA, AB, BS)  Occupational History  . Not on file  Social Needs  . Financial resource strain: Not on file  . Food insecurity    Worry: Not on file    Inability: Not on file  . Transportation needs    Medical: Not on file    Non-medical: Not on file  Tobacco Use  . Smoking status: Never Smoker  . Smokeless tobacco: Never Used  Substance and Sexual Activity  . Alcohol use: No  . Drug use: No  . Sexual activity: Yes  Lifestyle  . Physical activity    Days per week: Not on file    Minutes per session: Not on file  . Stress: Not on file  Relationships  . Social Musician on phone: Not on file    Gets together: Not on file    Attends religious service: Not on file    Active  member of club or organization: Not on file    Attends meetings of clubs or organizations: Not on file    Relationship status: Not on file  Other Topics Concern  . Not on file  Social History Narrative  . Not on file     Family History: The patient's family history includes COPD in her father; Hypertension in her mother.  ROS:   All other ROS reviewed and negative. Pertinent positives noted in the HPI.     EKGs/Labs/Other Studies Reviewed:   The following studies were personally reviewed by me today:  EKG:  EKG is  ordered today.  The ekg ordered today demonstrates normal sinus rhythm, heart rate 69, LVH by voltage, no acute ST-T changes, no prior infarction, and was personally reviewed by me.   Recent Labs: 04/12/2018: Platelets 329 05/25/2018:  Hemoglobin 13.7 07/10/2018: TSH 0.708 11/19/2018: ALT 17; BUN 15; Creatinine, Ser 0.97; Potassium 4.4; Sodium 142   Recent Lipid Panel    Component Value Date/Time   CHOL 233 (H) 11/19/2018 1542   TRIG 47 11/19/2018 1542   HDL 78 11/19/2018 1542   CHOLHDL 3.0 11/19/2018 1542   LDLCALC 146 (H) 11/19/2018 1542    Physical Exam:   VS:  BP (!) 160/90   Pulse 71   Temp 98.4 F (36.9 C)   Ht 4\' 11"  (1.499 m)   Wt 192 lb 12.8 oz (87.5 kg)   LMP 07/21/2011   SpO2 98%   BMI 38.94 kg/m    Wt Readings from Last 3 Encounters:  03/30/19 192 lb 12.8 oz (87.5 kg)  01/03/19 184 lb 9.6 oz (83.7 kg)  11/19/18 188 lb 12.8 oz (85.6 kg)    General: Well nourished, well developed, in no acute distress Heart: Atraumatic, normal size  Eyes: PEERLA, EOMI  Neck: Supple, no JVD Endocrine: No thryomegaly Cardiac: Normal S1, S2; RRR; no murmurs, rubs, or gallops Lungs: Clear to auscultation bilaterally, no wheezing, rhonchi or rales  Abd: Soft, nontender, no hepatomegaly  Ext: No edema, pulses 2+ Musculoskeletal: No deformities, BUE and BLE strength normal and equal Skin: Warm and dry, no rashes   Neuro: Alert and oriented to person, place, time, and situation, CNII-XII grossly intact, no focal deficits  Psych: Normal mood and affect   ASSESSMENT:   NAME@ is a 58 y.o. female who presents for the following: 1. Precordial pain   2. Essential hypertension   3. Mixed hyperlipidemia   4. Aortic ectasia (HCC)     PLAN:   1. Precordial pain -Her pain is rather atypical, and likely consistent with more GERD type symptoms.  She does have cardiovascular disease risk factors, and I think it is reasonable proceed with a coronary CTA.  She will take 100 mg of metoprolol tartrate 1 hour prior scan.  I also think it is worthwhile to get a good look at her entire thoracic aorta.  It was mentioned on her chest x-ray she had an ectatic aorta and we will proceed with his coronary CTA as well as evaluation of the  thoracic aorta.  This should not increase radiation dose, but the scan with of the imaging study should just be increased.  We will also obtain an echocardiogram to evaluate her cardiac function to have a baseline for her.  2. Essential hypertension -Blood pressure elevated today.  She reports anxiety and nervousness.  We will not increase or change medications today.  3. Mixed hyperlipidemia -LDL a bit up, we will discuss at her next  visit  4. Aortic ectasia (HCC) -Coronary CTA, with imaging of the thoracic aorta on that study   Disposition: Return in about 3 months (around 06/30/2019).  Medication Adjustments/Labs and Tests Ordered: Current medicines are reviewed at length with the patient today.  Concerns regarding medicines are outlined above.  Orders Placed This Encounter  Procedures  . CT CORONARY MORPH W/CTA COR W/SCORE W/CA W/CM &/OR WO/CM  . CT CORONARY FRACTIONAL FLOW RESERVE DATA PREP  . CT CORONARY FRACTIONAL FLOW RESERVE FLUID ANALYSIS  . Basic metabolic panel  . EKG 12-Lead  . ECHOCARDIOGRAM COMPLETE   Meds ordered this encounter  Medications  . metoprolol tartrate (LOPRESSOR) 100 MG tablet    Sig: Take 1 tablet (100 mg total) by mouth once for 1 dose. Take 1 hour prior to Cardiac CTA.    Dispense:  1 tablet    Refill:  0    Patient Instructions  Your cardiac CT will be scheduled at one of the below locations:   [facility] [address] [place], [place] [address] [phone]  If scheduled at [facility], please arrive at the [facility] main entrance of [facility] 30-45 minutes prior to test start time. Proceed to the [facility] Radiology Department (first floor) to check-in and test prep.  Please follow these instructions carefully (unless otherwise directed):   On the Night Before the Test: . Be sure to Drink plenty of water. . Do not consume any caffeinated/decaffeinated beverages or chocolate 12 hours prior to your  test. . Do not take any antihistamines 12 hours prior to your test.   On the Day of the Test: . Drink plenty of water. Do not drink any water within one hour of the test. . Do not eat any food 4 hours prior to the test. . You may take your regular medications prior to the test.  . Take metoprolol (Lopressor) one hour prior to test. . FEMALES- please wear underwire-free bra if available       After the Test: . Drink plenty of water. . After receiving IV contrast, you may experience a mild flushed feeling. This is normal. . On occasion, you may experience a mild rash up to 24 hours after the test. This is not dangerous. If this occurs, you can take Benadryl 25 mg and increase your fluid intake. . If you experience trouble breathing, this can be serious. If it is severe call 911 IMMEDIATELY. If it is mild, please call our office. . If you take any of these medications: Glipizide/Metformin, Avandament, Glucavance, please do not take 48 hours after completing test unless otherwise instructed.  Please contact the cardiac imaging nurse navigator should you have any questions/concerns Rockwell AlexandriaSara Wallace, RN Navigator Cardiac Imaging [facility] [phone] Office  [phone] Cell   Lab work: Your physician recommends that you return for lab work within one week of scheduled CTA: BMET (to check kidney function). No appointment is needed for blood work done in our office.  If you have labs (blood work) drawn today and your tests are completely normal, you will receive your results only by: Marland Kitchen. MyChart Message (if you have MyChart) OR . A paper copy in the mail If you have any lab test that is abnormal or we need to change your treatment, we will call you to review the results.    Follow-Up: At [facility], you and your health needs are our priority.  As part of our continuing  mission to provide you with exceptional heart care, we have created designated Provider  Care Teams.  These Care Teams include your primary Cardiologist (physician) and Advanced Practice Providers (APPs -  Physician Assistants and Nurse Practitioners) who all work together to provide you with the care you need, when you need it. . Please schedule a follow-up appointment to see Dr. Audie Box in 3 months.  Any Other Special Instructions Will Be Listed Below (If Applicable).  Other Testing:  Your physician has requested that you have an echocardiogram. Echocardiography is a painless test that uses sound waves to create images of your heart. It provides your doctor with information about the size and shape of your heart and how well your heart's chambers and valves are working. This procedure takes approximately one hour. There are no restrictions for this procedure.        Signed, Addison Naegeli. Audie Box, [place]  [address], [place], [place] [address] [phone]  03/30/2019 10:48 AM

## 2019-03-30 ENCOUNTER — Other Ambulatory Visit: Payer: Self-pay

## 2019-03-30 ENCOUNTER — Ambulatory Visit (INDEPENDENT_AMBULATORY_CARE_PROVIDER_SITE_OTHER): Payer: No Typology Code available for payment source | Admitting: Cardiovascular Disease

## 2019-03-30 ENCOUNTER — Encounter: Payer: Self-pay | Admitting: Cardiovascular Disease

## 2019-03-30 VITALS — BP 160/90 | HR 71 | Temp 98.4°F | Ht 59.0 in | Wt 192.8 lb

## 2019-03-30 DIAGNOSIS — I77819 Aortic ectasia, unspecified site: Secondary | ICD-10-CM

## 2019-03-30 DIAGNOSIS — E782 Mixed hyperlipidemia: Secondary | ICD-10-CM | POA: Diagnosis not present

## 2019-03-30 DIAGNOSIS — I10 Essential (primary) hypertension: Secondary | ICD-10-CM | POA: Diagnosis not present

## 2019-03-30 DIAGNOSIS — R072 Precordial pain: Secondary | ICD-10-CM

## 2019-03-30 MED ORDER — METOPROLOL TARTRATE 100 MG PO TABS: 100.0000 mg | ORAL_TABLET | Freq: Once | ORAL | 0 refills | Status: DC

## 2019-03-30 MED FILL — METOPROLOL TARTRATE 100 MG: 100 | 1 days supply | Qty: 1 | Fill #0

## 2019-03-30 NOTE — Patient Instructions (Signed)
Your cardiac CT will be scheduled at one of the below locations:   [facility] [address] [place], [place] [address] [phone]  If scheduled at [facility], please arrive at the [facility] main entrance of [facility] 30-45 minutes prior to test start time. Proceed to the [facility] Radiology Department (first floor) to check-in and test prep.  Please follow these instructions carefully (unless otherwise directed):   On the Night Before the Test: . Be sure to Drink plenty of water. . Do not consume any caffeinated/decaffeinated beverages or chocolate 12 hours prior to your test. . Do not take any antihistamines 12 hours prior to your test.   On the Day of the Test: . Drink plenty of water. Do not drink any water within one hour of the test. . Do not eat any food 4 hours prior to the test. . You may take your regular medications prior to the test.  . Take metoprolol (Lopressor) one hour prior to test. . FEMALES- please wear underwire-free bra if available       After the Test: . Drink plenty of water. . After receiving IV contrast, you may experience a mild flushed feeling. This is normal. . On occasion, you may experience a mild rash up to 24 hours after the test. This is not dangerous. If this occurs, you can take Benadryl 25 mg and increase your fluid intake. . If you experience trouble breathing, this can be serious. If it is severe call 911 IMMEDIATELY. If it is mild, please call our office. . If you take any of these medications: Glipizide/Metformin, Avandament, Glucavance, please do not take 48 hours after completing test unless otherwise instructed.  Please contact the cardiac imaging nurse navigator should you have any questions/concerns Marchia Fontaine, RN Navigator Cardiac Imaging [place] [facility] [phone] Office  [phone] Cell   Lab work: Your physician recommends that you return for lab work within  one week of scheduled CTA: BMET (to check kidney function). No appointment is needed for blood work done in our office.  If you have labs (blood work) drawn today and your tests are completely normal, you will receive your results only by: Marland Kitchen MyChart Message (if you have MyChart) OR . A paper copy in the mail If you have any lab test that is abnormal or we need to change your treatment, we will call you to review the results.    Follow-Up: At [facility], you and your health needs are our priority.  As part of our continuing mission to provide you with exceptional heart care, we have created designated Provider Care Teams.  These Care Teams include your primary Cardiologist (physician) and Advanced Practice Providers (APPs -  Physician Assistants and Nurse Practitioners) who all work together to provide you with the care you need, when you need it. . Please schedule a follow-up appointment to see Dr. Audie Box in 3 months.  Any Other Special Instructions Will Be Listed Below (If Applicable).  Other Testing:  Your physician has requested that you have an echocardiogram. Echocardiography is a painless test that uses sound waves to create images of your heart. It provides your doctor with information about the size and shape of your heart and how well your heart's chambers and valves are working. This procedure takes approximately one hour. There are no restrictions for this procedure.

## 2019-04-04 ENCOUNTER — Other Ambulatory Visit: Payer: Self-pay

## 2019-04-04 ENCOUNTER — Ambulatory Visit (HOSPITAL_COMMUNITY): Payer: No Typology Code available for payment source | Attending: Cardiology

## 2019-04-04 DIAGNOSIS — E782 Mixed hyperlipidemia: Secondary | ICD-10-CM | POA: Diagnosis present

## 2019-04-04 DIAGNOSIS — I10 Essential (primary) hypertension: Secondary | ICD-10-CM | POA: Diagnosis present

## 2019-04-04 DIAGNOSIS — R072 Precordial pain: Secondary | ICD-10-CM | POA: Diagnosis not present

## 2019-04-04 DIAGNOSIS — I77819 Aortic ectasia, unspecified site: Secondary | ICD-10-CM | POA: Diagnosis not present

## 2019-04-05 ENCOUNTER — Ambulatory Visit (INDEPENDENT_AMBULATORY_CARE_PROVIDER_SITE_OTHER): Payer: No Typology Code available for payment source | Admitting: Family Medicine

## 2019-04-05 ENCOUNTER — Encounter: Payer: Self-pay | Admitting: Family Medicine

## 2019-04-05 ENCOUNTER — Other Ambulatory Visit: Payer: Self-pay | Admitting: Family Medicine

## 2019-04-05 VITALS — BP 160/74 | HR 64 | Temp 98.6°F | Wt 192.0 lb

## 2019-04-05 DIAGNOSIS — Z23 Encounter for immunization: Secondary | ICD-10-CM

## 2019-04-05 DIAGNOSIS — R140 Abdominal distension (gaseous): Secondary | ICD-10-CM

## 2019-04-05 DIAGNOSIS — I10 Essential (primary) hypertension: Secondary | ICD-10-CM

## 2019-04-05 DIAGNOSIS — R0789 Other chest pain: Secondary | ICD-10-CM

## 2019-04-05 DIAGNOSIS — K5909 Other constipation: Secondary | ICD-10-CM | POA: Diagnosis not present

## 2019-04-05 LAB — BASIC METABOLIC PANEL
BUN/Creatinine Ratio: 13 (ref 9–23)
BUN: 13 mg/dL (ref 6–24)
CO2: 27 mmol/L (ref 20–29)
Calcium: 10 mg/dL (ref 8.7–10.2)
Chloride: 101 mmol/L (ref 96–106)
Creatinine, Ser: 1.01 mg/dL — ABNORMAL HIGH (ref 0.57–1.00)
GFR calc Af Amer: 71 mL/min/{1.73_m2} (ref >59)
GFR calc non Af Amer: 62 mL/min/{1.73_m2} (ref >59)
Glucose: 103 mg/dL — ABNORMAL HIGH (ref 65–99)
Potassium: 4.4 mmol/L (ref 3.5–5.2)
Sodium: 141 mmol/L (ref 134–144)

## 2019-04-05 MED ORDER — AMLODIPINE BESYLATE 5 MG PO TABS: 5.0000 mg | ORAL_TABLET | Freq: Every day | ORAL | 3 refills | Status: DC

## 2019-04-05 NOTE — Progress Notes (Signed)
Established Patient Office Visit  Subjective:  Patient ID: Nancy Osborne, female    DOB: [birth year]  Age: 58 y.o. MRN: [id number]  CC:  Chief Complaint  Patient presents with  . Hypertension    3 month f/u for htn. Patient stated she been out of her bp med for 3 weeks now    HPI EMMALYN HINSON presents for    Chest Discomfort Patient had Echo on 04/04/2019 She was evaluated for chest discomfort She also saw Gastroenterology and was started on PPI but she continued to have Chest pressure Her Echo cardiogram is normal with normal LVEF, LV chamber and normal heart valves. She still has the bloating but not chest pressure.  Comorbidities: stress, hypertension, copd  Prediabetes She is also prediabetic She reports that she sticks to a diabetic diet but is not exercising She denies polyuria, polydipsia, polyphagia  Lab Results  Component Value Date   HGBA1C 5.8 (H) 11/19/2018    Constipation She reports that she has bloating and reports that she has constipation and has to take ex-lax which helps her a whole lot.  She is doing fiber supplements She did not have this problem while sh was working She denies any abdominal pains She just feels so bloated it is hard for her to eat her meals  Hypertension: Patient here for follow-up of elevated blood pressure. She is not exercising and is adherent to low salt diet.  Blood pressure is well controlled at home. Cardiac symptoms none. Patient denies chest pain, claudication, exertional chest pressure/discomfort and irregular heart beat.  Cardiovascular risk factors: hypertension. Use of agents associated with hypertension: none. History of target organ damage: none. BP Readings from Last 3 Encounters:  04/05/19 (!) 160/74  03/30/19 (!) 160/90  01/03/19 (!) 142/82    Wt Readings from Last 3 Encounters:  04/05/19 192 lb (87.1 kg)  03/30/19 192 lb 12.8 oz (87.5 kg)  01/03/19 184 lb 9.6 oz (83.7 kg)     Past Medical History:   Diagnosis Date  . Hypertension     Past Surgical History:  Procedure Laterality Date  . c sections      Family History  Problem Relation Age of Onset  . Hypertension Mother   . COPD Father     Social History   Socioeconomic History  . Marital status: Married    Spouse name: Not on file  . Number of children: 3  . Years of education: Not on file  . Highest education level: Bachelor's degree (e.g., BA, AB, BS)  Occupational History  . Not on file  Social Needs  . Financial resource strain: Not on file  . Food insecurity    Worry: Not on file    Inability: Not on file  . Transportation needs    Medical: Not on file    Non-medical: Not on file  Tobacco Use  . Smoking status: Never Smoker  . Smokeless tobacco: Never Used  Substance and Sexual Activity  . Alcohol use: No  . Drug use: No  . Sexual activity: Yes  Lifestyle  . Physical activity    Days per week: Not on file    Minutes per session: Not on file  . Stress: Not on file  Relationships  . Social Musician on phone: Not on file    Gets together: Not on file    Attends religious service: Not on file    Active member of club or organization: Not on file  Attends meetings of clubs or organizations: Not on file    Relationship status: Not on file  . Intimate partner violence    Fear of current or ex partner: Not on file    Emotionally abused: Not on file    Physically abused: Not on file    Forced sexual activity: Not on file  Other Topics Concern  . Not on file  Social History Narrative  . Not on file    Outpatient Medications Prior to Visit  Medication Sig Dispense Refill  . Multiple Vitamins-Minerals (MULTIVITAMIN ADULT EXTRA C PO) multivitamin    . amLODipine (NORVASC) 10 MG tablet Take 0.5 tablets (5 mg total) by mouth daily.    . pantoprazole (PROTONIX) 40 MG tablet Take 1 tablet by mouth daily.    . metoprolol tartrate (LOPRESSOR) 100 MG tablet Take 1 tablet (100 mg total) by  mouth once for 1 dose. Take 1 hour prior to Cardiac CTA. 1 tablet 0   No facility-administered medications prior to visit.     Allergies  Allergen Reactions  . Sertraline Hcl Other (See Comments)    Anxiety and paranoia    ROS Review of Systems Review of Systems  Constitutional: Negative for activity change, appetite change, chills and fever.  HENT: Negative for congestion, nosebleeds, trouble swallowing and voice change.   Respiratory: Negative for cough, shortness of breath and wheezing.   Gastrointestinal: Negative for diarrhea, nausea and vomiting.  Genitourinary: Negative for difficulty urinating, dysuria, flank pain and hematuria.  Musculoskeletal: Negative for back pain, joint swelling and neck pain.  Neurological: Negative for dizziness, speech difficulty, light-headedness and numbness.  See HPI. All other review of systems negative.     Objective:    Physical Exam  BP (!) 160/74 (BP Location: Right Arm, Patient Position: Sitting, Cuff Size: Large)   Pulse 64   Temp 98.6 F (37 C) (Oral)   Wt 192 lb (87.1 kg)   LMP 07/21/2011   SpO2 98%   BMI 38.78 kg/m  Wt Readings from Last 3 Encounters:  04/05/19 192 lb (87.1 kg)  03/30/19 192 lb 12.8 oz (87.5 kg)  01/03/19 184 lb 9.6 oz (83.7 kg)   Physical Exam  Constitutional: Oriented to person, place, and time. Appears well-developed and well-nourished.  HENT:  Head: Normocephalic and atraumatic.  Eyes: Conjunctivae and EOM are normal.  Cardiovascular: Normal rate, regular rhythm, normal heart sounds and intact distal pulses.  No murmur heard. Pulmonary/Chest: Effort normal and breath sounds normal. No stridor. No respiratory distress. Has no wheezes.  Abdomen: nondistended, normoactive bs, soft, nontender Neurological: Is alert and oriented to person, place, and time.  Skin: Skin is warm. Capillary refill takes less than 2 seconds.  Psychiatric: Has a normal mood and affect. Behavior is normal. Judgment and  thought content normal.    IMPRESSIONS   1. Left ventricular ejection fraction, by visual estimation, is 60 to 65%. The left ventricle has normal function. Normal left ventricular size. Left ventricular septal wall thickness was mildly increased. Mildly increased left ventricular posterior  wall thickness. There is no left ventricular hypertrophy.  2. Left ventricular diastolic Doppler parameters are indeterminate pattern of LV diastolic filling.  3. Global right ventricle has normal systolic function.The right ventricular size is normal. No increase in right ventricular wall thickness.  4. Left atrial size was normal.  5. Right atrial size was normal.  6. The mitral valve is normal in structure. Trace mitral valve regurgitation. No evidence of mitral stenosis.  7.  The tricuspid valve is normal in structure. Tricuspid valve regurgitation was not visualized by color flow Doppler.  8. The aortic valve is normal in structure. Aortic valve regurgitation was not visualized by color flow Doppler. Structurally normal aortic valve, with no evidence of sclerosis or stenosis.  9. The pulmonic valve was normal in structure. Pulmonic valve regurgitation is not visualized by color flow Doppler. 10. The inferior vena cava is normal in size with greater than 50% respiratory variability, suggesting right atrial pressure of 3 mmHg.  FINDINGS  Left Ventricle: Left ventricular ejection fraction, by visual estimation, is 60 to 65%. The left ventricle has normal function. Left ventricular septal wall thickness was mildly increased. Mildly increased left ventricular posterior wall thickness.  There is no left ventricular hypertrophy. Normal left ventricular size. Spectral Doppler shows Left ventricular diastolic Doppler parameters are indeterminate pattern of LV diastolic filling. Normal left ventricular filling pressures.  Right Ventricle: The right ventricular size is normal. No increase in right ventricular  wall thickness. Global RV systolic function is has normal systolic function.  Left Atrium: Left atrial size was normal in size.  Right Atrium: Right atrial size was normal in size  Pericardium: There is no evidence of pericardial effusion.  Mitral Valve: The mitral valve is normal in structure. No evidence of mitral valve stenosis by observation. Trace mitral valve regurgitation.  Tricuspid Valve: The tricuspid valve is normal in structure. Tricuspid valve regurgitation was not visualized by color flow Doppler.  Aortic Valve: The aortic valve is normal in structure. Aortic valve regurgitation was not visualized by color flow Doppler. The aortic valve is structurally normal, with no evidence of sclerosis or stenosis.  Pulmonic Valve: The pulmonic valve was normal in structure. Pulmonic valve regurgitation is not visualized by color flow Doppler.  Aorta: The aortic root, ascending aorta and aortic arch are all structurally normal, with no evidence of dilitation or obstruction.  Venous: The inferior vena cava is normal in size with greater than 50% respiratory variability, suggesting right atrial pressure of 3 mmHg.  IAS/Shunts: No atrial level shunt detected by color flow Doppler. No ventricular septal defect is seen or detected. There is no evidence of an atrial septal defect.   Health Maintenance Due  Topic Date Due  . INFLUENZA VACCINE  01/22/2019    There are no preventive care reminders to display for this patient.  Lab Results  Component Value Date   TSH 0.708 07/10/2018   Lab Results  Component Value Date   WBC 5.6 05/25/2018   HGB 13.7 (A) 05/25/2018   HCT 40.8 05/25/2018   MCV 81.8 05/25/2018   PLT 329 04/12/2018   Lab Results  Component Value Date   NA 141 04/04/2019   K 4.4 04/04/2019   CO2 27 04/04/2019   GLUCOSE 103 (H) 04/04/2019   BUN 13 04/04/2019   CREATININE 1.01 (H) 04/04/2019   BILITOT 0.4 11/19/2018   ALKPHOS 111 11/19/2018   AST 22  11/19/2018   ALT 17 11/19/2018   PROT 7.5 11/19/2018   ALBUMIN 4.3 11/19/2018   CALCIUM 10.0 04/04/2019   ANIONGAP 11 04/12/2018   Lab Results  Component Value Date   CHOL 233 (H) 11/19/2018   Lab Results  Component Value Date   HDL 78 11/19/2018   Lab Results  Component Value Date   LDLCALC 146 (H) 11/19/2018   Lab Results  Component Value Date   TRIG 47 11/19/2018   Lab Results  Component Value Date   CHOLHDL 3.0  11/19/2018   Lab Results  Component Value Date   HGBA1C 5.8 (H) 11/19/2018      Assessment & Plan:   Problem List Items Addressed This Visit      Cardiovascular and Mediastinum   Essential hypertension   Relevant Medications   amLODipine (NORVASC) 5 MG tablet    Other Visit Diagnoses    Bloating    -  Primary   Need for prophylactic vaccination and inoculation against influenza       Relevant Orders   Flu Vaccine QUAD 6+ mos PF IM (Fluarix Quad PF)   Other constipation       Chest pressure         Assessment and Plan NORVELL URESTE is a 58 y.o. female who presents for follow up  Bloating and constipation - try metamucil again, instructed on the correct way to do so  Prediabetes-  Reviewed her fasting glucose from BMP on 04/04/2019 Chest Pressure - symptoms improving, reviewed the Echo extensively Hypertension -  Pt has been out of her home bp med amlodipine for 2 weeks, she should return to clinic for a nurse visit for bp check in one month and follow up with me in six months Need for vaccinations - flu vaccine given today  Meds ordered this encounter  Medications  . amLODipine (NORVASC) 5 MG tablet    Sig: Take 1 tablet (5 mg total) by mouth daily.    Dispense:  90 tablet    Refill:  3    Follow-up: Return in about 6 months (around 10/04/2019) for hypertension  (needs a bp check visit with a nurse in one month).    Forrest Moron, MD

## 2019-04-05 NOTE — Telephone Encounter (Signed)
Requested Prescriptions  Pending Prescriptions Disp Refills  . amLODipine (NORVASC) 5 MG tablet 90 tablet 3    Sig: Take 1 tablet (5 mg total) by mouth daily.     Cardiovascular:  Calcium Channel Blockers Failed - 04/05/2019  4:05 PM      Failed - Last BP in normal range    BP Readings from Last 1 Encounters:  04/05/19 (!) 160/74         Passed - Valid encounter within last 6 months    Recent Outpatient Visits          Today Bloating   Primary Care at [facility], Arlie Solomons, MD   3 months ago Bloating   Primary Care at [facility], Arlie Solomons, MD   4 months ago Essential hypertension   Primary Care at [facility], Arlie Solomons, MD   7 months ago Essential hypertension   Primary Care at [facility], [place], MD   8 months ago Somatic symptom disorder   Primary Care at [place], [place]      Future Appointments            In 6 months Forrest Moron, MD Primary Care at [place], [facility]

## 2019-04-05 NOTE — Patient Instructions (Addendum)
   1. Take metamucil in water or apple juice every day first thing in the morning before eating.  Wash it down with water.  2.  Walk 10 minutes while wearing your mask  3.  Take amlodipine 5mg  tablet once a day for hypertension  4.  Follow up with Gastroenterology  5. Return to clinic in 6 months   If you have lab work done today you will be contacted with your lab results within the next 2 weeks.  If you have not heard from [place] then please contact us. The fastest way to get your results is to register for My Chart.   IF you received an x-ray today, you will receive an invoice from [facility]. Please contact [facility] at [phone] with questions or concerns regarding your invoice.   IF you received labwork today, you will receive an invoice from [facility]. Please contact [facility] at [phone] with questions or concerns regarding your invoice.   Our billing staff will not be able to assist you with questions regarding bills from these companies.  You will be contacted with the lab results as soon as they are available. The fastest way to get your results is to activate your My Chart account. Instructions are located on the last page of this paperwork. If you have not heard from [place] regarding the results in 2 weeks, please contact this office.

## 2019-04-05 NOTE — Telephone Encounter (Signed)
Medication Refill - Medication: amLODipine (NORVASC) 5 MG tablet   Preferred Pharmacy (with phone number or street name):  [place], [place] - [address] [phone] (Phone) [phone] (Fax)

## 2019-06-04 IMAGING — CR DG CHEST 2V
2 series · 2 of 2 positions shown · non-contrast
Comparison: None.

CLINICAL DATA: Left-sided chest pain and shortness of breath for 2
days.

EXAM:
CHEST - 2 VIEW

[w chest pa]
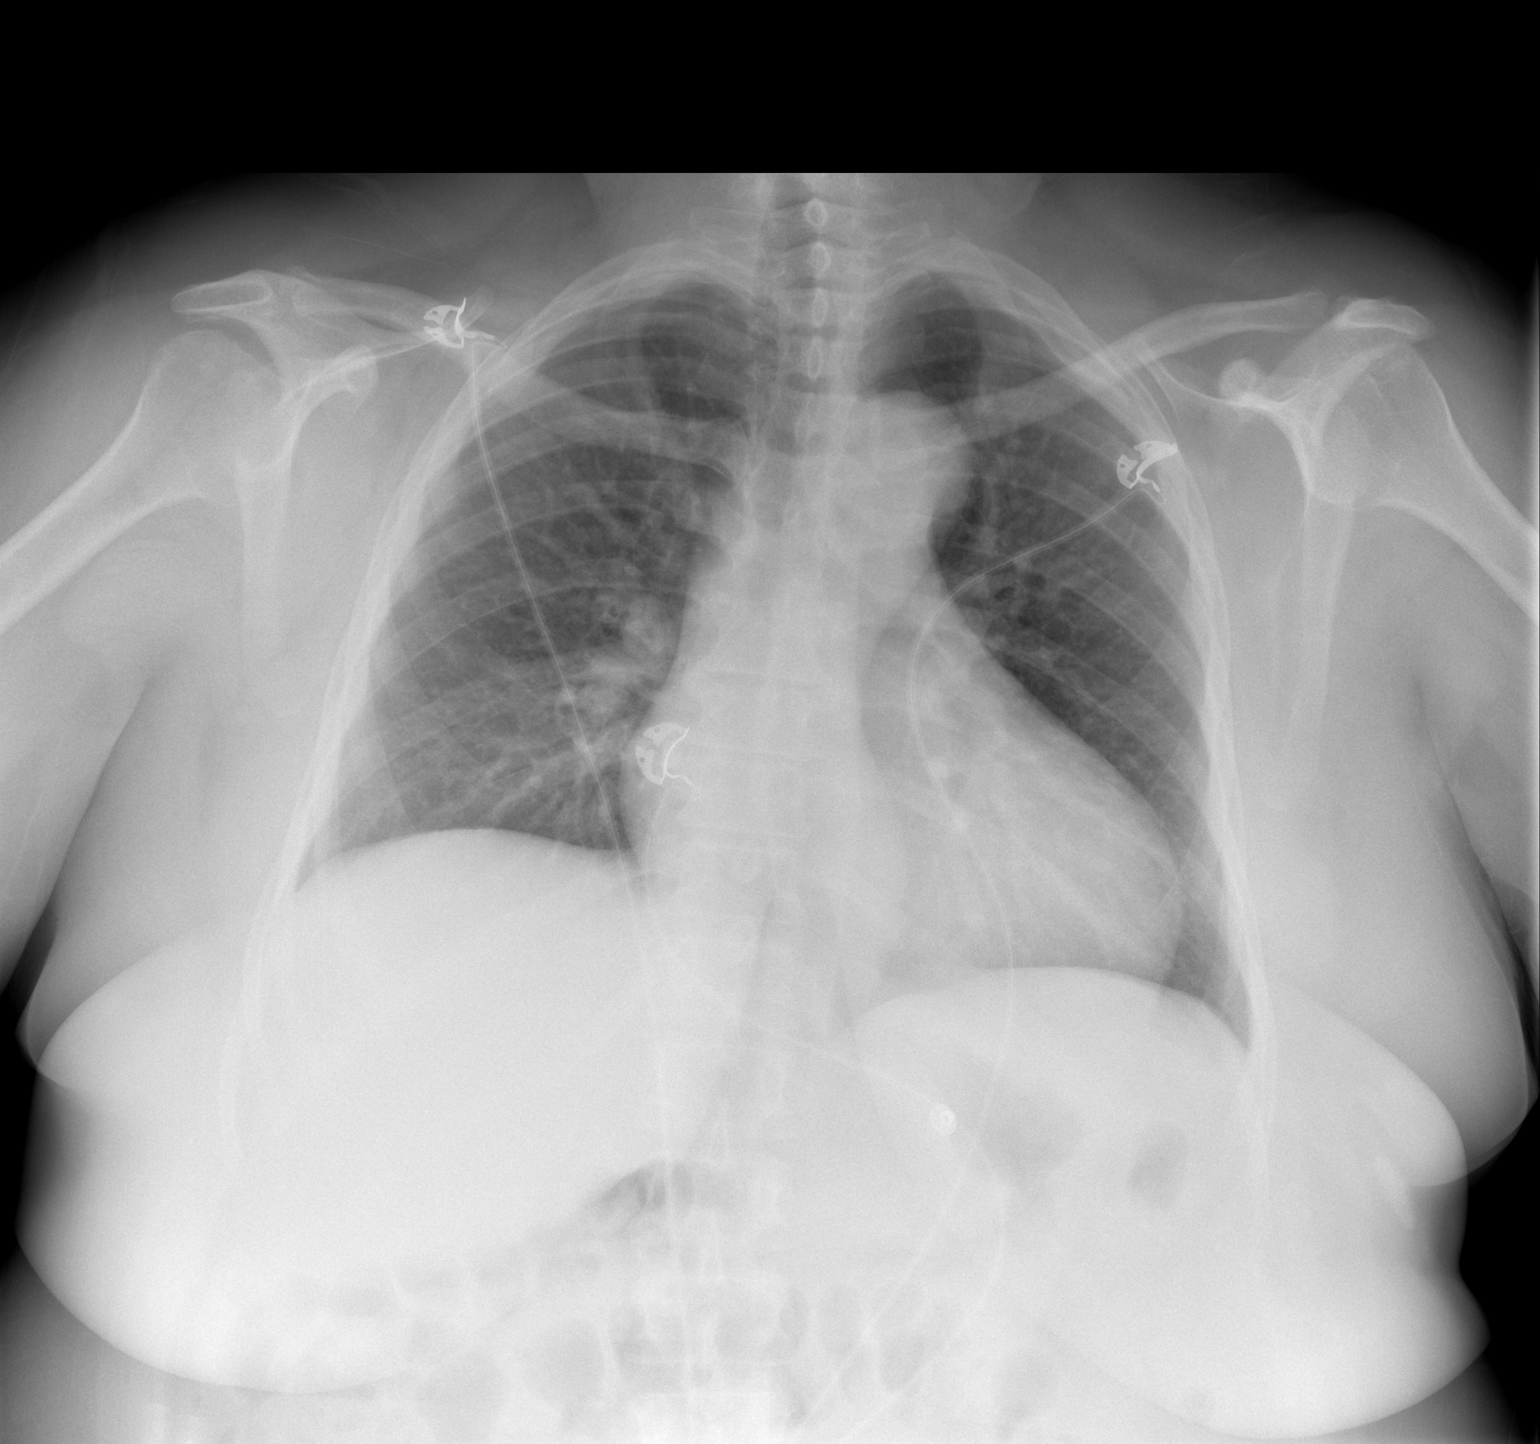

[w chest lat]
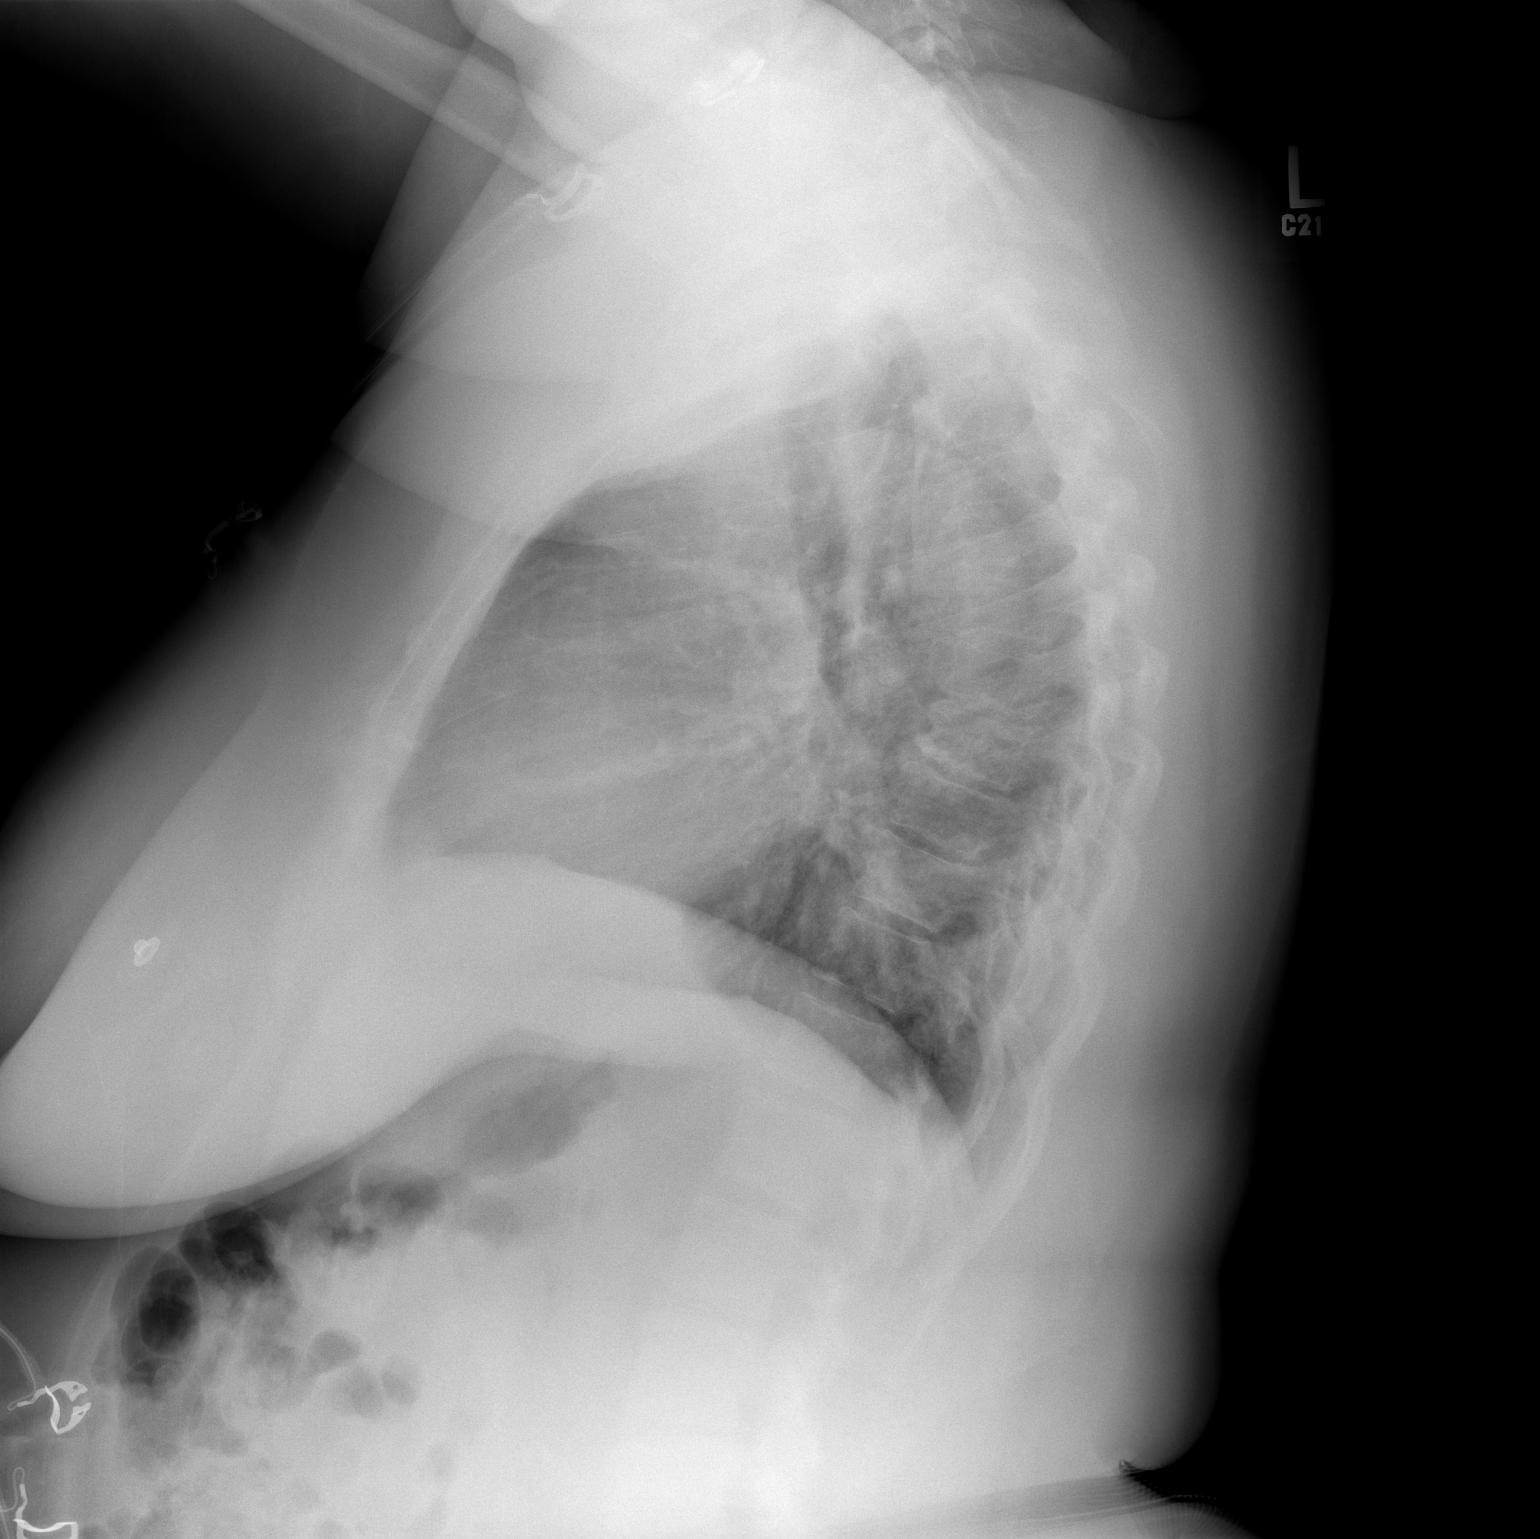

[2 of 2 positions shown; findings below may reference images not displayed]

FINDINGS: The heart size is within normal limits. Ectasia of thoracic aorta
noted. Both lungs are clear. The visualized skeletal structures are
unremarkable.
IMPRESSION: No active cardiopulmonary disease.

## 2019-06-28 NOTE — Progress Notes (Deleted)
Cardiology Office Note:   Date:  06/28/2019  NAME:  Nancy Osborne    MRN: [id number] DOB:  Jan 27, 1961   PCP:  Forrest Moron, MD  Cardiologist:  Evalina Field, MD  Electrophysiologist:  None   Referring MD: Forrest Moron, MD   No chief complaint on file. ***  History of Present Illness:   Nancy Osborne is a 59 y.o. female with a hx of GERD, HTN, constipation who presents for follow-up of chest pain. Echo normal. CCTA not done. CXR showed aortic ectasia.  Sent for evaluation of atypical GERD symptoms.  Her gastroenterologist requested her cardiac work-up.  A cardiac CTA was ordered however the patient has not obtained this.  Problem List: 1. GERD 2. Hypertension   Past Medical History: Past Medical History:  Diagnosis Date  . Hypertension     Past Surgical History: Past Surgical History:  Procedure Laterality Date  . c sections      Current Medications: No outpatient medications have been marked as taking for the 06/30/19 encounter (Appointment) with O'Neal, Cassie Freer, MD.     Allergies:    Sertraline hcl   Social History: Social History   Socioeconomic History  . Marital status: Married    Spouse name: Not on file  . Number of children: 3  . Years of education: Not on file  . Highest education level: Bachelor's degree (e.g., BA, AB, BS)  Occupational History  . Not on file  Tobacco Use  . Smoking status: Never Smoker  . Smokeless tobacco: Never Used  Substance and Sexual Activity  . Alcohol use: No  . Drug use: No  . Sexual activity: Yes  Other Topics Concern  . Not on file  Social History Narrative  . Not on file   Social Determinants of Health   Financial Resource Strain:   . Difficulty of Paying Living Expenses: Not on file  Food Insecurity:   . Worried About Charity fundraiser in the Last Year: Not on file  . Ran Out of Food in the Last Year: Not on file  Transportation Needs:   . Lack of Transportation (Medical): Not on file  . Lack  of Transportation (Non-Medical): Not on file  Physical Activity:   . Days of Exercise per Week: Not on file  . Minutes of Exercise per Session: Not on file  Stress:   . Feeling of Stress : Not on file  Social Connections:   . Frequency of Communication with Friends and Family: Not on file  . Frequency of Social Gatherings with Friends and Family: Not on file  . Attends Religious Services: Not on file  . Active Member of Clubs or Organizations: Not on file  . Attends Archivist Meetings: Not on file  . Marital Status: Not on file     Family History: The patient's ***family history includes COPD in her father; Hypertension in her mother.  ROS:   All other ROS reviewed and negative. Pertinent positives noted in the HPI.     EKGs/Labs/Other Studies Reviewed:   The following studies were personally reviewed by me today:  EKG:  EKG is *** ordered today.  The ekg ordered today demonstrates ***, and was personally reviewed by me.   TTE 04/04/2019  1. Left ventricular ejection fraction, by visual estimation, is 60 to 65%. The left ventricle has normal function. Normal left ventricular size. Left ventricular septal wall thickness was mildly increased. Mildly increased left ventricular posterior  wall  thickness. There is no left ventricular hypertrophy.  2. Left ventricular diastolic Doppler parameters are indeterminate pattern of LV diastolic filling.  3. Global right ventricle has normal systolic function.The right ventricular size is normal. No increase in right ventricular wall thickness.  4. Left atrial size was normal.  5. Right atrial size was normal.  6. The mitral valve is normal in structure. Trace mitral valve regurgitation. No evidence of mitral stenosis.  7. The tricuspid valve is normal in structure. Tricuspid valve regurgitation was not visualized by color flow Doppler.  8. The aortic valve is normal in structure. Aortic valve regurgitation was not visualized by color  flow Doppler. Structurally normal aortic valve, with no evidence of sclerosis or stenosis.  9. The pulmonic valve was normal in structure. Pulmonic valve regurgitation is not visualized by color flow Doppler. 10. The inferior vena cava is normal in size with greater than 50% respiratory variability, suggesting right atrial pressure of 3 mmHg.  Recent Labs: 07/10/2018: TSH 0.708 11/19/2018: ALT 17 04/04/2019: BUN 13; Creatinine, Ser 1.01; Potassium 4.4; Sodium 141   Recent Lipid Panel    Component Value Date/Time   CHOL 233 (H) 11/19/2018 1542   TRIG 47 11/19/2018 1542   HDL 78 11/19/2018 1542   CHOLHDL 3.0 11/19/2018 1542   LDLCALC 146 (H) 11/19/2018 1542    Physical Exam:   VS:  LMP 07/21/2011    Wt Readings from Last 3 Encounters:  04/05/19 192 lb (87.1 kg)  03/30/19 192 lb 12.8 oz (87.5 kg)  01/03/19 184 lb 9.6 oz (83.7 kg)    General: Well nourished, well developed, in no acute distress Heart: Atraumatic, normal size  Eyes: PEERLA, EOMI  Neck: Supple, no JVD Endocrine: No thryomegaly Cardiac: Normal S1, S2; RRR; no murmurs, rubs, or gallops Lungs: Clear to auscultation bilaterally, no wheezing, rhonchi or rales  Abd: Soft, nontender, no hepatomegaly  Ext: No edema, pulses 2+ Musculoskeletal: No deformities, BUE and BLE strength normal and equal Skin: Warm and dry, no rashes   Neuro: Alert and oriented to person, place, time, and situation, CNII-XII grossly intact, no focal deficits  Psych: Normal mood and affect   ASSESSMENT:   JAASIA Osborne is a 59 y.o. female who presents for the following: No diagnosis found.  PLAN:   There are no diagnoses linked to this encounter.  Disposition: No follow-ups on file.  Medication Adjustments/Labs and Tests Ordered: Current medicines are reviewed at length with the patient today.  Concerns regarding medicines are outlined above.  No orders of the defined types were placed in this encounter.  No orders of the defined types were  placed in this encounter.   There are no Patient Instructions on file for this visit.   Signed, Lenna Gilford. Flora Lipps, MD [facility]  [address] [place], [place] [address] [phone]  06/28/2019 7:02 AM

## 2019-06-30 ENCOUNTER — Ambulatory Visit: Payer: No Typology Code available for payment source | Admitting: Cardiovascular Disease

## 2019-07-25 LAB — HM PAP SMEAR: HM Pap smear: NORMAL

## 2019-08-31 ENCOUNTER — Encounter: Payer: Self-pay | Admitting: Cardiovascular Disease

## 2019-10-04 ENCOUNTER — Other Ambulatory Visit: Payer: Self-pay

## 2019-10-04 ENCOUNTER — Telehealth: Payer: Self-pay | Admitting: Family Medicine

## 2019-10-04 ENCOUNTER — Ambulatory Visit (INDEPENDENT_AMBULATORY_CARE_PROVIDER_SITE_OTHER): Payer: No Typology Code available for payment source | Admitting: Family Medicine

## 2019-10-04 ENCOUNTER — Encounter: Payer: Self-pay | Admitting: Family Medicine

## 2019-10-04 VITALS — BP 185/91 | HR 64 | Temp 98.2°F | Ht 60.0 in | Wt 185.6 lb

## 2019-10-04 DIAGNOSIS — K5900 Constipation, unspecified: Secondary | ICD-10-CM

## 2019-10-04 DIAGNOSIS — I10 Essential (primary) hypertension: Secondary | ICD-10-CM | POA: Diagnosis not present

## 2019-10-04 DIAGNOSIS — R140 Abdominal distension (gaseous): Secondary | ICD-10-CM

## 2019-10-04 DIAGNOSIS — R7309 Other abnormal glucose: Secondary | ICD-10-CM

## 2019-10-04 DIAGNOSIS — K5909 Other constipation: Secondary | ICD-10-CM

## 2019-10-04 LAB — POCT URINALYSIS DIP (MANUAL ENTRY)
Bilirubin, UA: NEGATIVE
Blood, UA: NEGATIVE
Glucose, UA: NEGATIVE mg/dL
Ketones, POC UA: NEGATIVE mg/dL
Leukocytes, UA: NEGATIVE
Nitrite, UA: NEGATIVE
Protein Ur, POC: NEGATIVE mg/dL
Spec Grav, UA: 1.025 (ref 1.010–1.025)
Urobilinogen, UA: 0.2 E.U./dL
pH, UA: 7.5 (ref 5.0–8.0)

## 2019-10-04 MED ORDER — AMLODIPINE BESYLATE 5 MG PO TABS: 5.0000 mg | ORAL_TABLET | Freq: Every day | ORAL | 1 refills | Status: DC

## 2019-10-04 MED ORDER — PANTOPRAZOLE SODIUM 40 MG PO TBEC: 40.0000 mg | DELAYED_RELEASE_TABLET | Freq: Every day | ORAL | 3 refills | Status: DC

## 2019-10-04 MED FILL — AMLODIPINE BESYLATE 5 MG TA: 5 | 90 days supply | Qty: 90 | Fill #0

## 2019-10-04 MED FILL — PANTOPRAZOLE SOD DR 40 MG T: 40 | 90 days supply | Qty: 90 | Fill #0

## 2019-10-04 NOTE — Telephone Encounter (Signed)
Called pt and lvmtcb about her following Dr. Creta Levin or staying with PCP with [facility]. Told pt to call Maralyn Sago back for the information.

## 2019-10-04 NOTE — Patient Instructions (Addendum)
     If you have lab work done today you will be contacted with your lab results within the next 2 weeks.  If you have not heard from us then please contact us. The fastest way to get your results is to register for My Chart.   IF you received an x-ray today, you will receive an invoice from [facility]. Please contact [facility] at [phone] with questions or concerns regarding your invoice.   IF you received labwork today, you will receive an invoice from [facility]. Please contact [facility] at [phone] with questions or concerns regarding your invoice.   Our billing staff will not be able to assist you with questions regarding bills from these companies.  You will be contacted with the lab results as soon as they are available. The fastest way to get your results is to activate your My Chart account. Instructions are located on the last page of this paperwork. If you have not heard from us regarding the results in 2 weeks, please contact this office.     Constipation, Adult Constipation is when a person has fewer bowel movements in a week than normal, has difficulty having a bowel movement, or has stools that are dry, hard, or larger than normal. Constipation may be caused by an underlying condition. It may become worse with age if a person takes certain medicines and does not take in enough fluids. Follow these instructions at home: Eating and drinking   Eat foods that have a lot of fiber, such as fresh fruits and vegetables, whole grains, and beans.  Limit foods that are high in fat, low in fiber, or overly processed, such as french fries, hamburgers, cookies, candies, and soda.  Drink enough fluid to keep your urine clear or pale yellow. General instructions  Exercise regularly or as told by your health care provider.  Go to the restroom when you have the urge to go. Do not hold it in.  Take over-the-counter and prescription medicines only as told by your  health care provider. These include any fiber supplements.  Practice pelvic floor retraining exercises, such as deep breathing while relaxing the lower abdomen and pelvic floor relaxation during bowel movements.  Watch your condition for any changes.  Keep all follow-up visits as told by your health care provider. This is important. Contact a health care provider if:  You have pain that gets worse.  You have a fever.  You do not have a bowel movement after 4 days.  You vomit.  You are not hungry.  You lose weight.  You are bleeding from the anus.  You have thin, pencil-like stools. Get help right away if:  You have a fever and your symptoms suddenly get worse.  You leak stool or have blood in your stool.  Your abdomen is bloated.  You have severe pain in your abdomen.  You feel dizzy or you faint. This information is not intended to replace advice given to you by your health care provider. Make sure you discuss any questions you have with your health care provider. Document Revised: 05/22/2017 Document Reviewed: 11/28/2015 Elsevier Patient Education  2020 [employer] Inc[employer]  

## 2019-10-04 NOTE — Telephone Encounter (Signed)
Pt called back and will discuses with husband if she is going to follow provider or stay with PCP. Will call back to confirm.

## 2019-10-04 NOTE — Progress Notes (Signed)
Established Patient Office Visit  Subjective:  Patient ID: Nancy Osborne, female    DOB: 02/07/1961  Age: 59 y.o. MRN: [id number]  CC:  Chief Complaint  Patient presents with  . Follow-up    x6 mos on medical conditions    HPI JEANNEMARIE SAWAYA presents for   Hypertension: Patient here for follow-up of elevated blood pressure. She is exercising and is adherent to low salt diet.  Blood pressure is not well controlled at home and she has not taken meds for hypertension for 8 months. Cardiac symptoms none. Patient denies chest pain, dyspnea, fatigue, near-syncope and orthopnea.  Cardiovascular risk factors: dyslipidemia and hypertension. Use of agents associated with hypertension: none. History of target organ damage: none. BP Readings from Last 3 Encounters:  10/04/19 (!) 185/91  04/05/19 (!) 160/74  03/30/19 (!) 160/90   Obesity  She reports that she is exercising. She is using high fiber foods to help with her weight.  She avoids sweets.  She is currently working on continued weight loss.  Wt Readings from Last 3 Encounters:  10/04/19 185 lb 9.6 oz (84.2 kg)  04/05/19 192 lb (87.1 kg)  03/30/19 192 lb 12.8 oz (87.5 kg)    Constipation/Obstipation Patient reports that when she eats after a few hours she feels bloated. She reports that the bloating is uncomfortable.  She gets a lot of gas as a result She states that she has a BM once a week. She feels like she wanted to do a colon cleanse.   Past Medical History:  Diagnosis Date  . Hypertension     Past Surgical History:  Procedure Laterality Date  . c sections      Family History  Problem Relation Age of Onset  . Hypertension Mother   . COPD Father     Social History   Socioeconomic History  . Marital status: Married    Spouse name: Not on file  . Number of children: 3  . Years of education: Not on file  . Highest education level: Bachelor's degree (e.g., BA, AB, BS)  Occupational History  . Not on file    Tobacco Use  . Smoking status: Never Smoker  . Smokeless tobacco: Never Used  Substance and Sexual Activity  . Alcohol use: No  . Drug use: No  . Sexual activity: Yes  Other Topics Concern  . Not on file  Social History Narrative  . Not on file   Social Determinants of Health   Financial Resource Strain:   . Difficulty of Paying Living Expenses:   Food Insecurity:   . Worried About Charity fundraiser in the Last Year:   . Arboriculturist in the Last Year:   Transportation Needs:   . Film/video editor (Medical):   Marland Kitchen Lack of Transportation (Non-Medical):   Physical Activity:   . Days of Exercise per Week:   . Minutes of Exercise per Session:   Stress:   . Feeling of Stress :   Social Connections:   . Frequency of Communication with Friends and Family:   . Frequency of Social Gatherings with Friends and Family:   . Attends Religious Services:   . Active Member of Clubs or Organizations:   . Attends Archivist Meetings:   Marland Kitchen Marital Status:   Intimate Partner Violence:   . Fear of Current or Ex-Partner:   . Emotionally Abused:   Marland Kitchen Physically Abused:   . Sexually Abused:  Outpatient Medications Prior to Visit  Medication Sig Dispense Refill  . Multiple Vitamins-Minerals (MULTIVITAMIN ADULT EXTRA C PO) multivitamin    . amLODipine (NORVASC) 5 MG tablet Take 1 tablet (5 mg total) by mouth daily. (Patient not taking: Reported on 10/04/2019) 90 tablet 3  . pantoprazole (PROTONIX) 40 MG tablet Take 1 tablet by mouth daily.     No facility-administered medications prior to visit.    Allergies  Allergen Reactions  . Sertraline Hcl Other (See Comments)    Anxiety and paranoia    ROS Review of Systems Review of Systems  Constitutional: Negative for activity change, appetite change, chills and fever.  HENT: Negative for congestion, nosebleeds, trouble swallowing and voice change.   Respiratory: Negative for cough, shortness of breath and wheezing.    Gastrointestinal: Negative for diarrhea, nausea and vomiting.  Genitourinary: Negative for difficulty urinating, dysuria, flank pain and hematuria.  Musculoskeletal: Negative for back pain, joint swelling and neck pain.  Neurological: Negative for dizziness, speech difficulty, light-headedness and numbness.  See HPI. All other review of systems negative.     Objective:    Physical Exam  BP (!) 185/91 (BP Location: Left Arm, Patient Position: Sitting, Cuff Size: Large)   Pulse 64   Temp 98.2 F (36.8 C) (Temporal)   Ht 5' (1.524 m)   Wt 185 lb 9.6 oz (84.2 kg)   LMP 07/21/2011   SpO2 99%   BMI 36.25 kg/m  Wt Readings from Last 3 Encounters:  10/04/19 185 lb 9.6 oz (84.2 kg)  04/05/19 192 lb (87.1 kg)  03/30/19 192 lb 12.8 oz (87.5 kg)   Physical Exam  Constitutional: Oriented to person, place, and time. Appears well-developed and well-nourished.  HENT:  Head: Normocephalic and atraumatic.  Eyes: Conjunctivae and EOM are normal.  Cardiovascular: Normal rate, regular rhythm, normal heart sounds and intact distal pulses.  No murmur heard. Pulmonary/Chest: Effort normal and breath sounds normal. No stridor. No respiratory distress. Has no wheezes.  Neurological: Is alert and oriented to person, place, and time.  Skin: Skin is warm. Capillary refill takes less than 2 seconds.  Psychiatric: Has a normal mood and affect. Behavior is normal. Judgment and thought content normal.    Health Maintenance Due  Topic Date Due  . PAP SMEAR-Modifier  09/16/2019    There are no preventive care reminders to display for this patient.  Lab Results  Component Value Date   TSH 0.708 07/10/2018   Lab Results  Component Value Date   WBC 5.6 05/25/2018   HGB 13.7 (A) 05/25/2018   HCT 40.8 05/25/2018   MCV 81.8 05/25/2018   PLT 329 04/12/2018   Lab Results  Component Value Date   NA 141 04/04/2019   K 4.4 04/04/2019   CO2 27 04/04/2019   GLUCOSE 103 (H) 04/04/2019   BUN 13  04/04/2019   CREATININE 1.01 (H) 04/04/2019   BILITOT 0.4 11/19/2018   ALKPHOS 111 11/19/2018   AST 22 11/19/2018   ALT 17 11/19/2018   PROT 7.5 11/19/2018   ALBUMIN 4.3 11/19/2018   CALCIUM 10.0 04/04/2019   ANIONGAP 11 04/12/2018   Lab Results  Component Value Date   CHOL 233 (H) 11/19/2018   Lab Results  Component Value Date   HDL 78 11/19/2018   Lab Results  Component Value Date   LDLCALC 146 (H) 11/19/2018   Lab Results  Component Value Date   TRIG 47 11/19/2018   Lab Results  Component Value Date   CHOLHDL  3.0 11/19/2018   Lab Results  Component Value Date   HGBA1C 5.8 (H) 11/19/2018      Assessment & Plan:   Problem List Items Addressed This Visit      Cardiovascular and Mediastinum   Essential hypertension - Primary Patient's blood pressure is at goal of 139/89 or less. Condition is stable. Continue current medications and treatment plan. I recommend that you exercise for 30-45 minutes 5 days a week. I also recommend a balanced diet with fruits and vegetables every day, lean meats, and little fried foods. The DASH diet (you can find this online) is a good example of this.    Relevant Medications   amLODipine (NORVASC) 5 MG tablet   Other Relevant Orders   CMP14+EGFR   Lipid panel   POCT urinalysis dipstick (Completed)   Microalbumin, urine     Other   Elevated hemoglobin A1c  -  Pt is prediabetic, discussed diabetes prevention   Relevant Orders   Hemoglobin A1c    Other Visit Diagnoses    Obstipation    -  Discussed protonix and fiber supplementation    Relevant Medications   pantoprazole (PROTONIX) 40 MG tablet   Bloating       Relevant Medications   pantoprazole (PROTONIX) 40 MG tablet   Other constipation          Meds ordered this encounter  Medications  . amLODipine (NORVASC) 5 MG tablet    Sig: Take 1 tablet (5 mg total) by mouth daily.    Dispense:  90 tablet    Refill:  1  . pantoprazole (PROTONIX) 40 MG tablet    Sig: Take  1 tablet (40 mg total) by mouth daily.    Dispense:  90 tablet    Refill:  3    Follow-up: Return in about 3 months (around 01/03/2020) for hypertension follow up either at PCP or [facility].    Forrest Moron, MD

## 2019-10-05 LAB — CMP14+EGFR
ALT: 22 IU/L (ref 0–32)
AST: 22 IU/L (ref 0–40)
Albumin/Globulin Ratio: 1.5 (ref 1.2–2.2)
Albumin: 4.2 g/dL (ref 3.8–4.9)
Alkaline Phosphatase: 101 IU/L (ref 39–117)
BUN/Creatinine Ratio: 19 (ref 9–23)
BUN: 18 mg/dL (ref 6–24)
Bilirubin Total: 0.2 mg/dL (ref 0.0–1.2)
CO2: 26 mmol/L (ref 20–29)
Calcium: 9.6 mg/dL (ref 8.7–10.2)
Chloride: 103 mmol/L (ref 96–106)
Creatinine, Ser: 0.97 mg/dL (ref 0.57–1.00)
GFR calc Af Amer: 74 mL/min/{1.73_m2} (ref >59)
GFR calc non Af Amer: 64 mL/min/{1.73_m2} (ref >59)
Globulin, Total: 2.8 g/dL (ref 1.5–4.5)
Glucose: 110 mg/dL — ABNORMAL HIGH (ref 65–99)
Potassium: 4.4 mmol/L (ref 3.5–5.2)
Sodium: 142 mmol/L (ref 134–144)
Total Protein: 7 g/dL (ref 6.0–8.5)

## 2019-10-05 LAB — LIPID PANEL
Chol/HDL Ratio: 2.7 ratio (ref 0.0–4.4)
Cholesterol, Total: 192 mg/dL (ref 100–199)
HDL: 72 mg/dL (ref >39)
LDL Chol Calc (NIH): 111 mg/dL — ABNORMAL HIGH (ref 0–99)
Triglycerides: 48 mg/dL (ref 0–149)
VLDL Cholesterol Cal: 9 mg/dL (ref 5–40)

## 2019-10-05 LAB — HEMOGLOBIN A1C
Est. average glucose Bld gHb Est-mCnc: 120 mg/dL
Hgb A1c MFr Bld: 5.8 % — ABNORMAL HIGH (ref 4.8–5.6)

## 2019-10-05 LAB — MICROALBUMIN, URINE: Microalbumin, Urine: 4.4 ug/mL

## 2020-12-27 DIAGNOSIS — Z20822 Contact with and (suspected) exposure to covid-19: Secondary | ICD-10-CM | POA: Diagnosis not present

## 2021-08-06 ENCOUNTER — Encounter: Payer: Self-pay | Admitting: Registered Nurse

## 2021-08-06 ENCOUNTER — Ambulatory Visit (INDEPENDENT_AMBULATORY_CARE_PROVIDER_SITE_OTHER): Payer: No Typology Code available for payment source | Admitting: Registered Nurse

## 2021-08-06 VITALS — BP 179/100 | HR 65 | Temp 98.2°F | Resp 18 | Ht 60.0 in | Wt 193.2 lb

## 2021-08-06 DIAGNOSIS — M545 Low back pain, unspecified: Secondary | ICD-10-CM | POA: Diagnosis not present

## 2021-08-06 DIAGNOSIS — I10 Essential (primary) hypertension: Secondary | ICD-10-CM | POA: Diagnosis not present

## 2021-08-06 DIAGNOSIS — Z1329 Encounter for screening for other suspected endocrine disorder: Secondary | ICD-10-CM | POA: Diagnosis not present

## 2021-08-06 DIAGNOSIS — Z13228 Encounter for screening for other metabolic disorders: Secondary | ICD-10-CM

## 2021-08-06 DIAGNOSIS — R5382 Chronic fatigue, unspecified: Secondary | ICD-10-CM

## 2021-08-06 DIAGNOSIS — G8929 Other chronic pain: Secondary | ICD-10-CM

## 2021-08-06 DIAGNOSIS — Z130 Encounter for screening for diseases of the blood and blood-forming organs and certain disorders involving the immune mechanism: Secondary | ICD-10-CM | POA: Diagnosis not present

## 2021-08-06 DIAGNOSIS — Z23 Encounter for immunization: Secondary | ICD-10-CM

## 2021-08-06 DIAGNOSIS — Z0000 Encounter for general adult medical examination without abnormal findings: Secondary | ICD-10-CM | POA: Diagnosis not present

## 2021-08-06 DIAGNOSIS — Z13220 Encounter for screening for lipoid disorders: Secondary | ICD-10-CM

## 2021-08-06 MED ORDER — AMLODIPINE BESY-BENAZEPRIL HCL 10-20 MG PO CAPS
1.0000 | ORAL_CAPSULE | Freq: Every day | ORAL | 1 refills | Status: DC
Start: 2021-08-06 — End: 2021-08-07

## 2021-08-06 MED ORDER — METHOCARBAMOL 500 MG PO TABS: 500.0000 mg | ORAL_TABLET | Freq: Two times a day (BID) | ORAL | 0 refills | Status: DC | PRN

## 2021-08-06 NOTE — Patient Instructions (Signed)
° ° ° °  If you have lab work done today you will be contacted with your lab results within the next 2 weeks.  If you have not heard from us then please contact us. The fastest way to get your results is to register for My Chart. ° ° °IF you received an x-ray today, you will receive an invoice from [facility]. Please contact [facility] at [phone] with questions or concerns regarding your invoice.  ° °IF you received labwork today, you will receive an invoice from [facility]. Please contact [facility] at [phone] with questions or concerns regarding your invoice.  ° °Our billing staff will not be able to assist you with questions regarding bills from these companies. ° °You will be contacted with the lab results as soon as they are available. The fastest way to get your results is to activate your My Chart account. Instructions are located on the last page of this paperwork. If you have not heard from us regarding the results in 2 weeks, please contact this office. °  ° ° ° °

## 2021-08-06 NOTE — Progress Notes (Signed)
Established Patient Office Visit  Subjective:  Patient ID: Nancy Osborne, female    DOB: 1960-11-15  Age: 61 y.o. MRN: [id number]  CC:  Chief Complaint  Patient presents with   New Patient (Initial Visit)    Patient states she is here to establish care and CPE.    HPI Nancy SinghDoris N Osborne presents for visit to est care and CPE  Formerly pt of Dr. Creta LevinStallings.   Hypertension: Patient Currently taking: no medications at this time.  Good effect. No AEs. Denies CV symptoms including: chest pain, shob, doe, headache, visual changes, fatigue, claudication, and dependent edema.   Previous readings and labs: BP Readings from Last 3 Encounters:  08/06/21 (!) 179/100  10/04/19 (!) 185/91  04/05/19 (!) 160/74   Lab Results  Component Value Date   CREATININE 0.97 10/04/2019    Lower back pain Ongoing 6 mo No acute injury or trauma No urinary symptoms.  Full ROM  Fatigue Ongoing.  No clear cause.  More easily tired No shob, doe, chest pain, headaches, visual changes No changes to diet or environment  Past Medical History:  Diagnosis Date   Hypertension     Past Surgical History:  Procedure Laterality Date   c sections      Family History  Problem Relation Age of Onset   Hypertension Mother    COPD Father     Social History   Socioeconomic History   Marital status: Married    Spouse name: Not on file   Number of children: 3   Years of education: Not on file   Highest education level: Bachelor's degree (e.g., BA, AB, BS)  Occupational History   Not on file  Tobacco Use   Smoking status: Never   Smokeless tobacco: Never  Vaping Use   Vaping Use: Never used  Substance and Sexual Activity   Alcohol use: No   Drug use: No   Sexual activity: Yes  Other Topics Concern   Not on file  Social History Narrative   Not on file   Social Determinants of Health   Financial Resource Strain: Not on file  Food Insecurity: Not on file  Transportation Needs: Not on file   Physical Activity: Not on file  Stress: Not on file  Social Connections: Not on file  Intimate Partner Violence: Not on file    Outpatient Medications Prior to Visit  Medication Sig Dispense Refill   Multiple Vitamins-Minerals (MULTIVITAMIN ADULT EXTRA C PO) multivitamin     pantoprazole (PROTONIX) 40 MG tablet Take 1 tablet (40 mg total) by mouth daily. 90 tablet 3   amLODipine (NORVASC) 5 MG tablet Take 1 tablet (5 mg total) by mouth daily. 90 tablet 1   No facility-administered medications prior to visit.    Allergies  Allergen Reactions   Sertraline Hcl Other (See Comments)    Anxiety and paranoia    ROS Review of Systems  Constitutional: Negative.   HENT: Negative.    Eyes: Negative.   Respiratory: Negative.    Cardiovascular: Negative.   Gastrointestinal: Negative.   Genitourinary: Negative.   Musculoskeletal: Negative.   Skin: Negative.   Neurological: Negative.   Psychiatric/Behavioral: Negative.    All other systems reviewed and are negative.    Objective:    Physical Exam Vitals and nursing note reviewed.  Constitutional:      General: She is not in acute distress.    Appearance: Normal appearance. She is obese. She is not ill-appearing, toxic-appearing or diaphoretic.  HENT:     Head: Normocephalic and atraumatic.     Right Ear: Tympanic membrane, ear canal and external ear normal. There is no impacted cerumen.     Left Ear: Tympanic membrane, ear canal and external ear normal. There is no impacted cerumen.     Nose: Nose normal. No congestion or rhinorrhea.     Mouth/Throat:     Mouth: Mucous membranes are moist.     Pharynx: Oropharynx is clear. No oropharyngeal exudate or posterior oropharyngeal erythema.  Eyes:     General: No scleral icterus.       Right eye: No discharge.        Left eye: No discharge.     Extraocular Movements: Extraocular movements intact.     Conjunctiva/sclera: Conjunctivae normal.     Pupils: Pupils are equal, round,  and reactive to light.  Cardiovascular:     Rate and Rhythm: Normal rate and regular rhythm.     Pulses: Normal pulses.     Heart sounds: Normal heart sounds. No murmur heard.   No friction rub. No gallop.  Pulmonary:     Effort: Pulmonary effort is normal. No respiratory distress.     Breath sounds: Normal breath sounds. No stridor. No wheezing, rhonchi or rales.  Chest:     Chest wall: No tenderness.  Abdominal:     General: Abdomen is flat. Bowel sounds are normal. There is no distension.     Palpations: Abdomen is soft. There is no mass.     Tenderness: There is no abdominal tenderness. There is no right CVA tenderness, left CVA tenderness, guarding or rebound.     Hernia: No hernia is present.  Musculoskeletal:        General: No swelling, tenderness, deformity or signs of injury. Normal range of motion.     Right lower leg: No edema.     Left lower leg: No edema.  Skin:    General: Skin is warm and dry.     Capillary Refill: Capillary refill takes less than 2 seconds.     Coloration: Skin is not jaundiced or pale.     Findings: No bruising, erythema, lesion or rash.  Neurological:     General: No focal deficit present.     Mental Status: She is alert and oriented to person, place, and time. Mental status is at baseline.     Cranial Nerves: No cranial nerve deficit.     Sensory: No sensory deficit.     Motor: No weakness.     Coordination: Coordination normal.     Gait: Gait normal.     Deep Tendon Reflexes: Reflexes normal.  Psychiatric:        Mood and Affect: Mood normal.        Behavior: Behavior normal.        Thought Content: Thought content normal.        Judgment: Judgment normal.    BP (!) 179/100    Pulse 65    Temp 98.2 F (36.8 C) (Temporal)    Resp 18    Ht 5' (1.524 m)    Wt 193 lb 3.2 oz (87.6 kg)    LMP 07/21/2011    SpO2 100%    BMI 37.73 kg/m  Wt Readings from Last 3 Encounters:  08/06/21 193 lb 3.2 oz (87.6 kg)  10/04/19 185 lb 9.6 oz (84.2 kg)   04/05/19 192 lb (87.1 kg)     Health Maintenance Due  Topic Date Due  Zoster Vaccines- Shingrix (1 of 2) Never done   PAP SMEAR-Modifier  09/16/2019   MAMMOGRAM  07/13/2020   Fecal DNA (Cologuard)  10/16/2020   INFLUENZA VACCINE  01/21/2021    There are no preventive care reminders to display for this patient.  Lab Results  Component Value Date   TSH 0.708 07/10/2018   Lab Results  Component Value Date   WBC 5.6 05/25/2018   HGB 13.7 (A) 05/25/2018   HCT 40.8 05/25/2018   MCV 81.8 05/25/2018   PLT 329 04/12/2018   Lab Results  Component Value Date   NA 142 10/04/2019   K 4.4 10/04/2019   CO2 26 10/04/2019   GLUCOSE 110 (H) 10/04/2019   BUN 18 10/04/2019   CREATININE 0.97 10/04/2019   BILITOT 0.2 10/04/2019   ALKPHOS 101 10/04/2019   AST 22 10/04/2019   ALT 22 10/04/2019   PROT 7.0 10/04/2019   ALBUMIN 4.2 10/04/2019   CALCIUM 9.6 10/04/2019   ANIONGAP 11 04/12/2018   Lab Results  Component Value Date   CHOL 192 10/04/2019   Lab Results  Component Value Date   HDL 72 10/04/2019   Lab Results  Component Value Date   LDLCALC 111 (H) 10/04/2019   Lab Results  Component Value Date   TRIG 48 10/04/2019   Lab Results  Component Value Date   CHOLHDL 2.7 10/04/2019   Lab Results  Component Value Date   HGBA1C 5.8 (H) 10/04/2019      Assessment & Plan:   Problem List Items Addressed This Visit   None Visit Diagnoses     Annual physical exam    -  Primary   Needs flu shot       Relevant Orders   Flu Vaccine QUAD 6+ mos PF IM (Fluarix Quad PF)   Chronic bilateral low back pain without sciatica       Relevant Medications   methocarbamol (ROBAXIN) 500 MG tablet   Primary hypertension       Relevant Medications   amLODipine-benazepril (LOTREL) 10-20 MG capsule   Screening for endocrine, metabolic and immunity disorder       Relevant Orders   CBC with Differential/Platelet   Comprehensive metabolic panel   Hemoglobin A1c   TSH    Urinalysis, Routine w reflex microscopic   Lipid screening       Relevant Orders   Lipid panel   Chronic fatigue       Relevant Orders   Vitamin D (25 hydroxy)       Meds ordered this encounter  Medications   methocarbamol (ROBAXIN) 500 MG tablet    Sig: Take 1 tablet (500 mg total) by mouth 2 (two) times daily as needed for muscle spasms.    Dispense:  60 tablet    Refill:  0    Order Specific Question:   Supervising Provider    Answer:   Neva Seat, JEFFREY R [[id number]]   amLODipine-benazepril (LOTREL) 10-20 MG capsule    Sig: Take 1 capsule by mouth daily.    Dispense:  90 capsule    Refill:  1    Order Specific Question:   Supervising Provider    Answer:   Neva Seat, JEFFREY R [[id number]]    Follow-up: No follow-ups on file.   PLAN Unremarkable exam Start amlodipine-benazepril 10-20mg  po qd for Htn. Return in 1-2 weeks for nurse visit bp check. Return in 3 mo for recheck w provider. Given methocarbamol for back pain. Ok to use tylenol. Labs collected.  Will follow up with the patient as warranted. Patient encouraged to call clinic with any questions, comments, or concerns.  Janeece Agee, NP

## 2021-08-07 ENCOUNTER — Telehealth: Payer: Self-pay | Admitting: Registered Nurse

## 2021-08-07 ENCOUNTER — Other Ambulatory Visit: Payer: Self-pay | Admitting: Registered Nurse

## 2021-08-07 ENCOUNTER — Encounter: Payer: Self-pay | Admitting: Registered Nurse

## 2021-08-07 ENCOUNTER — Other Ambulatory Visit (HOSPITAL_COMMUNITY): Payer: Self-pay

## 2021-08-07 DIAGNOSIS — I10 Essential (primary) hypertension: Secondary | ICD-10-CM

## 2021-08-07 LAB — URINALYSIS, ROUTINE W REFLEX MICROSCOPIC
Bilirubin Urine: NEGATIVE
Hgb urine dipstick: NEGATIVE
Ketones, ur: NEGATIVE
Leukocytes,Ua: NEGATIVE
Nitrite: NEGATIVE
RBC / HPF: NONE SEEN (ref 0–?)
Specific Gravity, Urine: 1.01 (ref 1.000–1.030)
Total Protein, Urine: NEGATIVE
Urine Glucose: NEGATIVE
Urobilinogen, UA: 0.2 (ref 0.0–1.0)
pH: 6.5 (ref 5.0–8.0)

## 2021-08-07 LAB — LIPID PANEL
Cholesterol: 204 mg/dL — ABNORMAL HIGH (ref <200)
HDL: 73 mg/dL (ref >=50)
LDL Cholesterol (Calc): 120 mg/dL (calc) — ABNORMAL HIGH
Non-HDL Cholesterol (Calc): 131 mg/dL (calc) — ABNORMAL HIGH (ref <130)
Total CHOL/HDL Ratio: 2.8 (calc) (ref <5.0)
Triglycerides: 35 mg/dL (ref <150)

## 2021-08-07 LAB — CBC WITH DIFFERENTIAL/PLATELET
Basophils Absolute: 0 10*3/uL (ref 0.0–0.1)
Basophils Relative: 0.9 % (ref 0.0–3.0)
Eosinophils Absolute: 0 10*3/uL (ref 0.0–0.7)
Eosinophils Relative: 0.4 % (ref 0.0–5.0)
HCT: 39 % (ref 36.0–46.0)
Hemoglobin: 12.6 g/dL (ref 12.0–15.0)
Lymphocytes Relative: 27.5 % (ref 12.0–46.0)
Lymphs Abs: 1.4 10*3/uL (ref 0.7–4.0)
MCHC: 32.3 g/dL (ref 30.0–36.0)
MCV: 84.5 fl (ref 78.0–100.0)
Monocytes Absolute: 0.3 10*3/uL (ref 0.1–1.0)
Monocytes Relative: 6.4 % (ref 3.0–12.0)
Neutro Abs: 3.4 10*3/uL (ref 1.4–7.7)
Neutrophils Relative %: 64.8 % (ref 43.0–77.0)
Platelets: 252 10*3/uL (ref 150.0–400.0)
RBC: 4.62 Mil/uL (ref 3.87–5.11)
RDW: 15.2 % (ref 11.5–15.5)
WBC: 5.2 10*3/uL (ref 4.0–10.5)

## 2021-08-07 LAB — COMPREHENSIVE METABOLIC PANEL
AG Ratio: 1.2 (calc) (ref 1.0–2.5)
ALT: 15 U/L (ref 6–29)
AST: 17 U/L (ref 10–35)
Albumin: 3.9 g/dL (ref 3.6–5.1)
Alkaline phosphatase (APISO): 98 U/L (ref 37–153)
BUN: 19 mg/dL (ref 7–25)
CO2: 25 mmol/L (ref 20–32)
Calcium: 9.5 mg/dL (ref 8.6–10.4)
Chloride: 103 mmol/L (ref 98–110)
Creat: 0.92 mg/dL (ref 0.50–1.05)
Globulin: 3.3 g/dL (calc) (ref 1.9–3.7)
Glucose, Bld: 99 mg/dL (ref 65–99)
Potassium: 4.1 mmol/L (ref 3.5–5.3)
Sodium: 141 mmol/L (ref 135–146)
Total Bilirubin: 0.4 mg/dL (ref 0.2–1.2)
Total Protein: 7.2 g/dL (ref 6.1–8.1)

## 2021-08-07 LAB — HEMOGLOBIN A1C: Hgb A1c MFr Bld: 6.2 % (ref 4.6–6.5)

## 2021-08-07 LAB — TSH: TSH: 0.71 u[IU]/mL (ref 0.35–5.50)

## 2021-08-07 LAB — VITAMIN D 25 HYDROXY (VIT D DEFICIENCY, FRACTURES): VITD: 40 ng/mL (ref 30.00–100.00)

## 2021-08-07 MED ORDER — AMLODIPINE BESYLATE 10 MG PO TABS
10.0000 mg | ORAL_TABLET | Freq: Every day | ORAL | 1 refills | Status: DC
  Filled 2021-08-07: qty 90, 90d supply, fill #0
  Filled 2021-11-28: qty 90, 90d supply, fill #1

## 2021-08-07 NOTE — Telephone Encounter (Signed)
Pt called in stating that there are to many side effects for the amlodipine benazepril. She wanted to know if she can go back to just taking the amlodipine? Pt can be reached at the home # and uses [facility] in [address]

## 2021-08-07 NOTE — Telephone Encounter (Signed)
Lvm for patient to return call to inform

## 2021-08-07 NOTE — Telephone Encounter (Signed)
Called left another message

## 2021-08-07 NOTE — Telephone Encounter (Signed)
Have sent amlodipine She should keep BP check in 2-3 weeks nurse visit and see me in 3 mo  Thanks  Rich

## 2021-08-23 ENCOUNTER — Encounter: Payer: Self-pay | Admitting: Registered Nurse

## 2021-11-28 ENCOUNTER — Other Ambulatory Visit (HOSPITAL_COMMUNITY): Payer: Self-pay

## 2022-03-24 ENCOUNTER — Encounter: Payer: Self-pay | Admitting: Nurse Practitioner

## 2022-03-24 ENCOUNTER — Telehealth: Payer: Self-pay

## 2022-03-24 NOTE — Telephone Encounter (Signed)
Pt was on her way and could not find [place], by the time she did it was past the grace period, RS for 10/19  1st no show, fee waived, letter sent KO

## 2022-03-24 NOTE — Telephone Encounter (Signed)
Noted  

## 2022-03-24 NOTE — Telephone Encounter (Signed)
Pt was a no show for a toc with Lauren on 03/24/22, I sent a letter.

## 2022-04-10 ENCOUNTER — Other Ambulatory Visit (HOSPITAL_COMMUNITY): Payer: Self-pay

## 2022-04-10 ENCOUNTER — Encounter: Payer: Self-pay | Admitting: Nurse Practitioner

## 2022-04-10 ENCOUNTER — Ambulatory Visit (INDEPENDENT_AMBULATORY_CARE_PROVIDER_SITE_OTHER): Payer: Federal, State, Local not specified - PPO | Admitting: Nurse Practitioner

## 2022-04-10 VITALS — BP 190/108 | HR 67 | Temp 98.2°F | Wt 184.2 lb

## 2022-04-10 DIAGNOSIS — M62830 Muscle spasm of back: Secondary | ICD-10-CM

## 2022-04-10 DIAGNOSIS — R7303 Prediabetes: Secondary | ICD-10-CM

## 2022-04-10 DIAGNOSIS — I10 Essential (primary) hypertension: Secondary | ICD-10-CM

## 2022-04-10 DIAGNOSIS — E785 Hyperlipidemia, unspecified: Secondary | ICD-10-CM

## 2022-04-10 DIAGNOSIS — Z124 Encounter for screening for malignant neoplasm of cervix: Secondary | ICD-10-CM | POA: Diagnosis not present

## 2022-04-10 DIAGNOSIS — Z23 Encounter for immunization: Secondary | ICD-10-CM

## 2022-04-10 LAB — CBC
HCT: 40.2 % (ref 36.0–46.0)
Hemoglobin: 13 g/dL (ref 12.0–15.0)
MCHC: 32.4 g/dL (ref 30.0–36.0)
MCV: 84.3 fl (ref 78.0–100.0)
Platelets: 260 10*3/uL (ref 150.0–400.0)
RBC: 4.77 Mil/uL (ref 3.87–5.11)
RDW: 15.8 % — ABNORMAL HIGH (ref 11.5–15.5)
WBC: 5 10*3/uL (ref 4.0–10.5)

## 2022-04-10 LAB — LIPID PANEL
Cholesterol: 205 mg/dL — ABNORMAL HIGH (ref 0–200)
HDL: 67.8 mg/dL (ref >39.00)
LDL Cholesterol: 127 mg/dL — ABNORMAL HIGH (ref 0–99)
NonHDL: 137.44
Total CHOL/HDL Ratio: 3
Triglycerides: 50 mg/dL (ref 0.0–149.0)
VLDL: 10 mg/dL (ref 0.0–40.0)

## 2022-04-10 LAB — BASIC METABOLIC PANEL
BUN: 14 mg/dL (ref 6–23)
CO2: 29 mEq/L (ref 19–32)
Calcium: 9.8 mg/dL (ref 8.4–10.5)
Chloride: 102 mEq/L (ref 96–112)
Creatinine, Ser: 0.81 mg/dL (ref 0.40–1.20)
GFR: 78.21 mL/min (ref >60.00)
Glucose, Bld: 100 mg/dL — ABNORMAL HIGH (ref 70–99)
Potassium: 3.5 mEq/L (ref 3.5–5.1)
Sodium: 139 mEq/L (ref 135–145)

## 2022-04-10 LAB — HEMOGLOBIN A1C: Hgb A1c MFr Bld: 6.1 % (ref 4.6–6.5)

## 2022-04-10 MED ORDER — AMLODIPINE BESYLATE 5 MG PO TABS
5.0000 mg | ORAL_TABLET | Freq: Every day | ORAL | 1 refills | Status: DC
  Filled 2022-04-10: qty 90, 90d supply, fill #0
  Filled 2022-07-15: qty 90, 90d supply, fill #1

## 2022-04-10 NOTE — Progress Notes (Signed)
New Patient Visit  BP (!) 190/108 (BP Location: Right Arm, Cuff Size: Large)   Pulse 67   Temp 98.2 F (36.8 C) (Temporal)   Wt 184 lb 3.2 oz (83.6 kg)   LMP 07/21/2011   SpO2 98%   BMI 35.97 kg/m    Subjective:    Patient ID: Nancy Osborne, female    DOB: May 25, 1961, 61 y.o.   MRN: [id number]  CC: Chief Complaint  Patient presents with   Transitions Of Care    TOC. Est care. Pt c/o fatigue, low energy and feeling weakness in her legs when she tries to stand up after sitting too long.     HPI: Nancy Osborne is a 61 y.o. female presents to transfer care to a new provider.  Introduced to Publishing rights manager role and practice setting.  All questions answered.  Discussed provider/patient relationship and expectations.  She has a history of hypertension. She states that it has not been controlled. She is currently taking amlodipine 10mg  daily. She has been out of this medication for the last few weeks. She checks her blood pressure at home and was 140-150s/80s before she ran out of her medication. She denies chest pain and shortness of breath. She has been having a few intermittent headaches that go away after drinking water. She has noticed some swelling in her feet when she takes the amlodipine with the 10mg  dose, however not the 5mg  dose.   She has been experiencing upper back and shoulder pain from being at her job. She has been having pain with lifting heavy boxes. Bending and lifting makes the pain worse. She is asking for a note to provide her work to limit heavy lifting.  Depression and Anxiety Screen Done:     08/06/2021    2:16 PM 10/04/2019    8:26 AM 04/05/2019    8:05 AM 01/03/2019    8:39 AM 11/19/2018   11:43 AM  Depression screen PHQ 2/9  Decreased Interest 0 0 0 0 0  Down, Depressed, Hopeless 0 0 0 0 0  PHQ - 2 Score 0 0 0 0 0  Altered sleeping 0      Tired, decreased energy 0      Change in appetite 0      Feeling bad or failure about yourself  0      Trouble  concentrating 0      Moving slowly or fidgety/restless 0      Suicidal thoughts 0      PHQ-9 Score 0      Difficult doing work/chores Not difficult at all          10/04/2019    8:27 AM 08/17/2018    8:55 AM 04/13/2018   12:26 PM  GAD 7 : Generalized Anxiety Score  Nervous, Anxious, on Edge 0 0 3  Control/stop worrying 0 0 3  Worry too much - different things 0 0 1  Trouble relaxing 0 0 3  Restless 0 1 1  Easily annoyed or irritable 0 0 2  Afraid - awful might happen 0 0 3  Total GAD 7 Score 0 1 16  Anxiety Difficulty Not difficult at all Not difficult at all Not difficult at all    Past Medical History:  Diagnosis Date   Hyperlipidemia    Hypertension    Prediabetes     Past Surgical History:  Procedure Laterality Date   CESAREAN SECTION      Family History  Problem Relation  Age of Onset   Hypertension Mother    COPD Father      Social History   Tobacco Use   Smoking status: Never   Smokeless tobacco: Never  Vaping Use   Vaping Use: Never used  Substance Use Topics   Alcohol use: No   Drug use: No    No current outpatient medications on file prior to visit.   No current facility-administered medications on file prior to visit.     Review of Systems  Constitutional:  Positive for fatigue. Negative for fever.  HENT: Negative.    Eyes: Negative.   Respiratory: Negative.    Cardiovascular: Negative.   Gastrointestinal:  Positive for constipation. Negative for diarrhea.       Acid reflux  Genitourinary: Negative.   Musculoskeletal:  Positive for back pain.  Skin: Negative.   Neurological:  Positive for headaches (intermittent). Negative for dizziness.  Psychiatric/Behavioral: Negative.        Objective:    BP (!) 190/108 (BP Location: Right Arm, Cuff Size: Large)   Pulse 67   Temp 98.2 F (36.8 C) (Temporal)   Wt 184 lb 3.2 oz (83.6 kg)   LMP 07/21/2011   SpO2 98%   BMI 35.97 kg/m   Wt Readings from Last 3 Encounters:  04/10/22 184 lb 3.2  oz (83.6 kg)  08/06/21 193 lb 3.2 oz (87.6 kg)  10/04/19 185 lb 9.6 oz (84.2 kg)    BP Readings from Last 3 Encounters:  04/10/22 (!) 190/108  08/06/21 (!) 179/100  10/04/19 (!) 185/91    Physical Exam Vitals and nursing note reviewed.  Constitutional:      General: She is not in acute distress.    Appearance: Normal appearance.  HENT:     Head: Normocephalic.     Right Ear: Tympanic membrane, ear canal and external ear normal.     Left Ear: Tympanic membrane, ear canal and external ear normal.  Eyes:     Conjunctiva/sclera: Conjunctivae normal.  Cardiovascular:     Rate and Rhythm: Normal rate and regular rhythm.     Pulses: Normal pulses.     Heart sounds: Normal heart sounds.  Pulmonary:     Effort: Pulmonary effort is normal.     Breath sounds: Normal breath sounds.  Abdominal:     Palpations: Abdomen is soft.     Tenderness: There is no abdominal tenderness.  Musculoskeletal:     Cervical back: Normal range of motion and neck supple. No tenderness.     Right lower leg: No edema.     Left lower leg: No edema.  Lymphadenopathy:     Cervical: No cervical adenopathy.  Skin:    General: Skin is warm.  Neurological:     General: No focal deficit present.     Mental Status: She is alert and oriented to person, place, and time.  Psychiatric:        Mood and Affect: Mood normal.        Behavior: Behavior normal.        Thought Content: Thought content normal.        Judgment: Judgment normal.       Assessment & Plan:   Problem List Items Addressed This Visit       Cardiovascular and Mediastinum   Essential hypertension - Primary    Chronic, not controlled. BP today 190/108. She has been out of her blood pressure medication for the last 2 weeks. She is taking amlodipine 5mg  daily.  When she increased it to 10mg , she had ankle and foot swelling. Will refill her amlodipine 5mg  daily and have her come back in 4 weeks. May need to add a second medication if blood  pressure is still elevated. Check BMP, CBC today.       Relevant Medications   amLODipine (NORVASC) 5 MG tablet   Other Relevant Orders   Basic metabolic panel   CBC     Other   Muscle spasm of back    She has been experiencing upper back and shoulder pain when lifting heavy boxes at work. There are other areas that she can work in but needs a note that states she can't lift heavy boxes. Work note provided.       Prediabetes    Last A1c was 6.2%. Will recheck A1c today. Discussed nutrition, exercise.       Relevant Orders   Hemoglobin A1c   Hyperlipidemia    Last lipid panel was elevated with total cholesterol 204 and LDL 120. Current ASCVD score 18.4%, however blood pressure not controlled. Will check lipid panel today and re-evaluate ASCVD score next visit. Follow-up in 4 weeks.   The 10-year ASCVD risk score (Arnett DK, et al., 2019) is: 18.4%   Values used to calculate the score:     Age: 13 years     Sex: Female     Is Non-Hispanic African American: Yes     Diabetic: No     Tobacco smoker: No     Systolic Blood Pressure: 190 mmHg     Is BP treated: Yes     HDL Cholesterol: 73 mg/dL     Total Cholesterol: 204 mg/dL       Relevant Medications   amLODipine (NORVASC) 5 MG tablet   Other Relevant Orders   Lipid panel   Other Visit Diagnoses     Screening for cervical cancer       Referral placed to GYN. Her prior GYN retired.    Relevant Orders   Ambulatory referral to Gynecology   Need for influenza vaccination       Flu vaccine given today   Relevant Orders   Flu Vaccine QUAD 6+ mos PF IM (Fluarix Quad PF)        Follow up plan: Return in about 4 weeks (around 05/08/2022) for HTN.

## 2022-04-10 NOTE — Assessment & Plan Note (Signed)
Chronic, not controlled. BP today 190/108. She has been out of her blood pressure medication for the last 2 weeks. She is taking amlodipine 5mg  daily. When she increased it to 10mg , she had ankle and foot swelling. Will refill her amlodipine 5mg  daily and have her come back in 4 weeks. May need to add a second medication if blood pressure is still elevated. Check BMP, CBC today.

## 2022-04-10 NOTE — Assessment & Plan Note (Signed)
She has been experiencing upper back and shoulder pain when lifting heavy boxes at work. There are other areas that she can work in but needs a note that states she can't lift heavy boxes. Work note provided.

## 2022-04-10 NOTE — Patient Instructions (Signed)
It was great to see you!  Start your amlodipine 5mg  daily again. Keep checking your blood pressure at home.   Let's follow-up in 4 weeks, sooner if you have concerns.  If a referral was placed today, you will be contacted for an appointment. Please note that routine referrals can sometimes take up to 3-4 weeks to process. Please call our office if you haven't heard anything after this time frame.  Take care,  Vance Peper, NP

## 2022-04-10 NOTE — Assessment & Plan Note (Signed)
Last lipid panel was elevated with total cholesterol 204 and LDL 120. Current ASCVD score 18.4%, however blood pressure not controlled. Will check lipid panel today and re-evaluate ASCVD score next visit. Follow-up in 4 weeks.   The 10-year ASCVD risk score (Arnett DK, et al., 2019) is: 18.4%   Values used to calculate the score:     Age: 61 years     Sex: Female     Is Non-Hispanic African American: Yes     Diabetic: No     Tobacco smoker: No     Systolic Blood Pressure: 423 mmHg     Is BP treated: Yes     HDL Cholesterol: 73 mg/dL     Total Cholesterol: 204 mg/dL

## 2022-04-10 NOTE — Progress Notes (Signed)
Toc

## 2022-04-10 NOTE — Assessment & Plan Note (Signed)
Last A1c was 6.2%. Will recheck A1c today. Discussed nutrition, exercise.

## 2022-04-11 NOTE — Progress Notes (Signed)
Called and informed patient of results and provider instructions. Patient voiced understanding. Sw cma

## 2022-05-06 ENCOUNTER — Encounter: Payer: Self-pay | Admitting: Nurse Practitioner

## 2022-05-06 ENCOUNTER — Ambulatory Visit: Payer: Federal, State, Local not specified - PPO | Admitting: Nurse Practitioner

## 2022-05-06 VITALS — BP 154/100 | HR 64 | Temp 98.4°F | Ht 60.0 in | Wt 187.2 lb

## 2022-05-06 DIAGNOSIS — I7300 Raynaud's syndrome without gangrene: Secondary | ICD-10-CM | POA: Insufficient documentation

## 2022-05-06 DIAGNOSIS — I10 Essential (primary) hypertension: Secondary | ICD-10-CM

## 2022-05-06 NOTE — Patient Instructions (Addendum)
It was great to see you!  Keep taking your amlodipine 1 tablet daily. Start checking your blood pressure at home.   Call Dr. Wyline Beady to set up an appointment with GYN: Address: [address], [place], [place] [address] Phone: [phone]  Let's follow-up in 4 weeks , sooner if you have concerns.  If a referral was placed today, you will be contacted for an appointment. Please note that routine referrals can sometimes take up to 3-4 weeks to process. Please call our office if you haven't heard anything after this time frame.  Take care,  Rodman Pickle, NP

## 2022-05-06 NOTE — Assessment & Plan Note (Signed)
Chronic, not controlled. BP still elevated at 154/100. Discussed adding on a second medication, however she would like to hold off for the next few weeks. Encouraged her to start checking her blood pressure at home and limiting the amount of salt in her diet.

## 2022-05-06 NOTE — Progress Notes (Signed)
   Established Patient Office Visit  Subjective   Patient ID: Nancy Osborne, female    DOB: [birth year]  Age: 61 y.o. MRN: [id number]  Chief Complaint  Patient presents with   Follow-up    4 week f/u HTN.  C/o having numbness in her fingers on the Rt hand off/on.     HPI  Nancy Osborne is here to follow-up on hypertension. She has also noticed numbness in her right index finger, middle finger, and ring finger intermittently, especially when hse touches something cold.   She has been taking her blood pressure medication daily. She has not checked her blood pressure at home. She has noticed slight swelling in her ankles. She denies chest pain, shortness of breath, and headaches.   She has also noticed that her index, middle, and ring finger on her right hand go numb intermittently. It tends to happen when it gets cold out or she is reaching into the freezer. She also notes that her skin on these fingers will change colors. This has happened once before. She denies pain in her fingers.      ROS See pertinent positives and negatives per HPI.    Objective:     BP (!) 154/100 (BP Location: Right Arm, Cuff Size: Large)   Pulse 64   Temp 98.4 F (36.9 C) (Temporal)   Ht 5' (1.524 m)   Wt 187 lb 3.2 oz (84.9 kg)   LMP 07/21/2011   SpO2 98%   BMI 36.56 kg/m    Physical Exam Vitals and nursing note reviewed.  Constitutional:      General: She is not in acute distress.    Appearance: Normal appearance.  HENT:     Head: Normocephalic.  Eyes:     Conjunctiva/sclera: Conjunctivae normal.  Cardiovascular:     Rate and Rhythm: Normal rate and regular rhythm.     Pulses: Normal pulses.     Heart sounds: Normal heart sounds.  Pulmonary:     Effort: Pulmonary effort is normal.     Breath sounds: Normal breath sounds.  Musculoskeletal:     Cervical back: Normal range of motion.  Skin:    General: Skin is warm.  Neurological:     General: No focal deficit present.     Mental  Status: She is alert and oriented to person, place, and time.  Psychiatric:        Mood and Affect: Mood normal.        Behavior: Behavior normal.        Thought Content: Thought content normal.        Judgment: Judgment normal.      Assessment & Plan:   Problem List Items Addressed This Visit       Cardiovascular and Mediastinum   Essential hypertension - Primary    Chronic, not controlled. BP still elevated at 154/100. Discussed adding on a second medication, however she would like to hold off for the next few weeks. Encouraged her to start checking her blood pressure at home and limiting the amount of salt in her diet.       Raynaud's disease without gangrene    Symptoms consistent with Raynauds. Encouraged her to wear gloves if she is going into the cold. Also discussed not smoking and increasing exercise as able.        Return in about 4 weeks (around 06/03/2022) for HTN.    Gerre Scull, NP

## 2022-05-06 NOTE — Assessment & Plan Note (Signed)
Symptoms consistent with Raynauds. Encouraged her to wear gloves if she is going into the cold. Also discussed not smoking and increasing exercise as able.

## 2022-06-03 ENCOUNTER — Encounter: Payer: Self-pay | Admitting: Nurse Practitioner

## 2022-06-03 ENCOUNTER — Ambulatory Visit (INDEPENDENT_AMBULATORY_CARE_PROVIDER_SITE_OTHER): Payer: Federal, State, Local not specified - PPO | Admitting: Nurse Practitioner

## 2022-06-03 VITALS — BP 138/82 | HR 57 | Temp 98.3°F | Ht 60.0 in | Wt 189.0 lb

## 2022-06-03 DIAGNOSIS — Z124 Encounter for screening for malignant neoplasm of cervix: Secondary | ICD-10-CM | POA: Diagnosis not present

## 2022-06-03 DIAGNOSIS — K224 Dyskinesia of esophagus: Secondary | ICD-10-CM | POA: Insufficient documentation

## 2022-06-03 DIAGNOSIS — E538 Deficiency of other specified B group vitamins: Secondary | ICD-10-CM

## 2022-06-03 DIAGNOSIS — R140 Abdominal distension (gaseous): Secondary | ICD-10-CM | POA: Insufficient documentation

## 2022-06-03 DIAGNOSIS — K219 Gastro-esophageal reflux disease without esophagitis: Secondary | ICD-10-CM | POA: Diagnosis not present

## 2022-06-03 DIAGNOSIS — I10 Essential (primary) hypertension: Secondary | ICD-10-CM

## 2022-06-03 DIAGNOSIS — K5904 Chronic idiopathic constipation: Secondary | ICD-10-CM | POA: Insufficient documentation

## 2022-06-03 DIAGNOSIS — R079 Chest pain, unspecified: Secondary | ICD-10-CM | POA: Insufficient documentation

## 2022-06-03 LAB — VITAMIN B12: Vitamin B-12: 735 pg/mL (ref 211–911)

## 2022-06-03 NOTE — Assessment & Plan Note (Signed)
She has been experiencing an increase in reflux and a cramping sensation in her stomach when she eats. Will have her trial prilosec 20mg  daily for 2 weeks over the counter to see if this helps with her symptoms. If ongoing, may need referral to GI.

## 2022-06-03 NOTE — Assessment & Plan Note (Signed)
Chronic, stable. BP today 138/82. Continue amlodipine 5mg  daily and limiting salt in diet. Follow-up in 6 months.

## 2022-06-03 NOTE — Patient Instructions (Addendum)
It was great to see you!  Start prilosec 20mg  once a day for 2 weeks. You can get this at any pharmacy.   Keep taking your blood pressure medication.   We are checking your vitamin B12 levels today.   Let's follow-up in 2 months, sooner if you have concerns.  If a referral was placed today, you will be contacted for an appointment. Please note that routine referrals can sometimes take up to 3-4 weeks to process. Please call our office if you haven't heard anything after this time frame.  Take care,  , NP

## 2022-06-03 NOTE — Progress Notes (Signed)
Established Patient Office Visit  Subjective   Patient ID: Nancy Osborne, female    DOB: 21-Feb-1961  Age: 61 y.o. MRN: [id number]  Chief Complaint  Patient presents with   Follow-up    Follow up for htn, having ingestion issues , when she eat's she gets full with eat small amount .     HPI  Nancy Osborne is here to follow-up on hypertension.   She has not been checking her blood pressure at home. She has been working 12 hour shifts 6 days a week due to the busy season. She has taken her medication every day. She denies chest pain and shortness of breath.   She has been having some stomach pain intermittently. She has been having some spasms after she eats. She states that this will happen with any food and sometimes water. She denies diarrhea and constipation. She states that if she doesn't spasm or cramp if she doesn't eat or drink. She also endorses some burning at times. This is typically after she eats spaghetti or spicy foods. She denies fevers and blood in her stool. She describes the sensation as a "hiccup." This started about 6 months ago.   She has been having fatigue and would like her b12 levels checked. She has needed injections in the past.     ROS See pertinent positives and negatives per HPI.    Objective:     BP 138/82 (BP Location: Right Arm, Cuff Size: Large)   Pulse (!) 57   Temp 98.3 F (36.8 C) (Oral)   Ht 5' (1.524 m)   Wt 189 lb (85.7 kg)   LMP 07/21/2011   SpO2 96%   BMI 36.91 kg/m    Physical Exam Vitals and nursing note reviewed.  Constitutional:      General: She is not in acute distress.    Appearance: Normal appearance.  HENT:     Head: Normocephalic.  Eyes:     Conjunctiva/sclera: Conjunctivae normal.  Cardiovascular:     Rate and Rhythm: Normal rate and regular rhythm.     Pulses: Normal pulses.     Heart sounds: Normal heart sounds.  Pulmonary:     Effort: Pulmonary effort is normal.     Breath sounds: Normal breath sounds.   Abdominal:     General: There is no distension.     Palpations: Abdomen is soft.     Tenderness: There is no abdominal tenderness. There is no guarding.     Hernia: No hernia is present.  Musculoskeletal:     Cervical back: Normal range of motion.  Skin:    General: Skin is warm.  Neurological:     General: No focal deficit present.     Mental Status: She is alert and oriented to person, place, and time.  Psychiatric:        Mood and Affect: Mood normal.        Behavior: Behavior normal.        Thought Content: Thought content normal.        Judgment: Judgment normal.      Results for orders placed or performed in visit on 06/03/22  Vitamin B12  Result Value Ref Range   Vitamin B-12 735 211 - 911 pg/mL      The 10-year ASCVD risk score (Arnett DK, et al., 2019) is: 8.2%    Assessment & Plan:   Problem List Items Addressed This Visit       Cardiovascular and Mediastinum  Essential hypertension - Primary    Chronic, stable. BP today 138/82. Continue amlodipine 5mg  daily and limiting salt in diet. Follow-up in 6 months.         Digestive   Gastroesophageal reflux disease    She has been experiencing an increase in reflux and a cramping sensation in her stomach when she eats. Will have her trial prilosec 20mg  daily for 2 weeks over the counter to see if this helps with her symptoms. If ongoing, may need referral to GI.       Other Visit Diagnoses     Screening for cervical cancer       She would like a referral to new GYN for cervical cancer screening/pap smears as her prior one retired. Referral placed today.   Relevant Orders   Ambulatory referral to Gynecology   Vitamin B 12 deficiency       She has a history of needing injections in the past. Will check vitamin B12 levels today and treat based on results.   Relevant Orders   Vitamin B12 (Completed)       Return in about 2 months (around 08/04/2022) for after 08/06/21 , CPE.    10/03/2022,  NP

## 2022-06-04 ENCOUNTER — Telehealth: Payer: Self-pay

## 2022-06-04 NOTE — Telephone Encounter (Signed)
I left a message for the patient to return my call.

## 2022-06-04 NOTE — Telephone Encounter (Signed)
-----   Message from Burnell Blanks, CMA sent at 06/04/2022  8:17 AM EST -----  ----- Message ----- From: Gerre Scull, NP Sent: 06/03/2022  10:02 PM EST To: Burnell Blanks, CMA  Please let Kindle know her vitamin B12 levels are in the high range of normal at this time and there is not an indication for B12 injections.

## 2022-06-27 ENCOUNTER — Telehealth: Payer: Self-pay

## 2022-06-27 ENCOUNTER — Other Ambulatory Visit: Payer: Self-pay

## 2022-06-27 DIAGNOSIS — Z1231 Encounter for screening mammogram for malignant neoplasm of breast: Secondary | ICD-10-CM

## 2022-06-27 NOTE — Telephone Encounter (Signed)
LVM for pt to call the office to schedule mobile mammo unit.

## 2022-07-15 ENCOUNTER — Other Ambulatory Visit (HOSPITAL_COMMUNITY): Payer: Self-pay

## 2022-08-20 ENCOUNTER — Encounter: Payer: Self-pay | Admitting: Nurse Practitioner

## 2022-08-20 ENCOUNTER — Other Ambulatory Visit (HOSPITAL_COMMUNITY): Payer: Self-pay

## 2022-08-20 ENCOUNTER — Ambulatory Visit: Payer: Federal, State, Local not specified - PPO | Admitting: Nurse Practitioner

## 2022-08-20 VITALS — BP 142/80 | HR 57 | Temp 98.8°F | Ht 60.0 in | Wt 185.6 lb

## 2022-08-20 DIAGNOSIS — I10 Essential (primary) hypertension: Secondary | ICD-10-CM

## 2022-08-20 DIAGNOSIS — Z7184 Encounter for health counseling related to travel: Secondary | ICD-10-CM | POA: Diagnosis not present

## 2022-08-20 MED ORDER — AMLODIPINE BESYLATE 5 MG PO TABS
5.0000 mg | ORAL_TABLET | Freq: Every day | ORAL | 1 refills | Status: DC
  Filled 2022-08-20 – 2022-11-20 (×2): qty 90, 90d supply, fill #0
  Filled 2023-02-25: qty 90, 90d supply, fill #1

## 2022-08-20 MED ORDER — ATOVAQUONE-PROGUANIL HCL 250-100 MG PO TABS
1.0000 | ORAL_TABLET | Freq: Every day | ORAL | 0 refills | Status: AC
  Filled 2022-08-20: qty 35, 35d supply, fill #0

## 2022-08-20 NOTE — Patient Instructions (Addendum)
It was great to see you!  Start malarone 1 tablet daily to prevent malaria. Start this 1-2 days before you leave, every day during your trip, then for 7 days after you get back.   Wear compression socks during the flights and getting up and walking during the flight intermittently.   Let's follow-up in 3 months, sooner if you have concerns.  If a referral was placed today, you will be contacted for an appointment. Please note that routine referrals can sometimes take up to 3-4 weeks to process. Please call our office if you haven't heard anything after this time frame.  Take care,  Vance Peper, NP

## 2022-08-20 NOTE — Assessment & Plan Note (Signed)
Chronic, stable.  She did forget to take her blood pressure medication last night.  BP today 142/80.  Continue amlodipine 5 mg daily.  Follow-up in 3 months.

## 2022-08-20 NOTE — Progress Notes (Signed)
   Established Patient Office Visit  Subjective   Patient ID: Nancy Osborne, female    DOB: July 24, 1960  Age: 62 y.o. MRN: [id number]  Chief Complaint  Patient presents with   Well Check    Check up over before travel    HPI  ELPIDA MONGAR is here to follow up on hypertension before going to [place] for 3 weeks.   HYPERTENSION  Hypertension status: controlled  Satisfied with current treatment? yes Duration of hypertension: chronic BP monitoring frequency:  not checking BP medication side effects:  no Medication compliance: good compliance Previous BP meds: amlodipine Aspirin: no Recurrent headaches: no Visual changes: no Palpitations: no Dyspnea: no Chest pain: no Lower extremity edema: no Dizzy/lightheaded: no    ROS See pertinent positives and negatives per HPI.    Objective:     BP (!) 142/80 (BP Location: Right Arm, Cuff Size: Large)   Pulse (!) 57   Temp 98.8 F (37.1 C)   Ht 5' (1.524 m)   Wt 185 lb 9.6 oz (84.2 kg)   LMP 07/21/2011   SpO2 98%   BMI 36.25 kg/m    Physical Exam Vitals and nursing note reviewed.  Constitutional:      General: She is not in acute distress.    Appearance: Normal appearance.  HENT:     Head: Normocephalic.  Eyes:     Conjunctiva/sclera: Conjunctivae normal.  Cardiovascular:     Rate and Rhythm: Normal rate and regular rhythm.     Pulses: Normal pulses.     Heart sounds: Normal heart sounds.  Pulmonary:     Effort: Pulmonary effort is normal.     Breath sounds: Normal breath sounds.  Musculoskeletal:     Cervical back: Normal range of motion.  Skin:    General: Skin is warm.  Neurological:     General: No focal deficit present.     Mental Status: She is alert and oriented to person, place, and time.  Psychiatric:        Mood and Affect: Mood normal.        Behavior: Behavior normal.        Thought Content: Thought content normal.        Judgment: Judgment normal.     Assessment & Plan:   Problem List  Items Addressed This Visit       Cardiovascular and Mediastinum   Essential hypertension - Primary    Chronic, stable.  She did forget to take her blood pressure medication last night.  BP today 142/80.  Continue amlodipine 5 mg daily.  Follow-up in 3 months.      Relevant Medications   amLODipine (NORVASC) 5 MG tablet   Other Visit Diagnoses     Travel advice encounter       Wear compression socks on flight, get up and move around regularly. Going to [place] will prophylactically treat for malaria with malorone.       Return in about 3 months (around 11/18/2022) for CPE.    Charyl Dancer, NP

## 2022-08-26 ENCOUNTER — Other Ambulatory Visit (HOSPITAL_COMMUNITY): Payer: Self-pay

## 2022-11-20 ENCOUNTER — Other Ambulatory Visit (HOSPITAL_COMMUNITY): Payer: Self-pay

## 2022-12-23 ENCOUNTER — Other Ambulatory Visit: Payer: Self-pay

## 2022-12-23 ENCOUNTER — Other Ambulatory Visit (HOSPITAL_COMMUNITY): Payer: Self-pay

## 2022-12-23 MED ORDER — AMOXICILLIN 500 MG PO CAPS
ORAL_CAPSULE | ORAL | 0 refills | Status: DC
  Filled 2022-12-23: qty 30, 10d supply, fill #0
  Filled 2022-12-23: qty 30, 8d supply, fill #0

## 2022-12-29 ENCOUNTER — Other Ambulatory Visit: Payer: Self-pay

## 2023-02-25 ENCOUNTER — Other Ambulatory Visit (HOSPITAL_COMMUNITY): Payer: Self-pay

## 2023-02-25 MED ORDER — AMOXICILLIN 500 MG PO CAPS
500.0000 mg | ORAL_CAPSULE | ORAL | 0 refills | Status: AC
  Filled 2023-02-25: qty 30, 7d supply, fill #0

## 2023-06-11 ENCOUNTER — Other Ambulatory Visit (HOSPITAL_COMMUNITY): Payer: Self-pay

## 2023-06-11 ENCOUNTER — Other Ambulatory Visit: Payer: Self-pay | Admitting: Nurse Practitioner

## 2023-06-11 MED ORDER — AMLODIPINE BESYLATE 5 MG PO TABS
5.0000 mg | ORAL_TABLET | Freq: Every day | ORAL | 0 refills | Status: DC
  Filled 2023-06-11: qty 30, 30d supply, fill #0

## 2023-06-11 NOTE — Telephone Encounter (Signed)
Requesting: amLODipine (NORVASC) 5 MG tablet  Last Visit: 08/20/2022 Next Visit: Visit date not found Last Refill: 08/20/2022  Please Advise

## 2023-07-21 ENCOUNTER — Other Ambulatory Visit: Payer: Self-pay | Admitting: Nurse Practitioner

## 2023-07-21 ENCOUNTER — Other Ambulatory Visit (HOSPITAL_COMMUNITY): Payer: Self-pay

## 2023-07-21 NOTE — Telephone Encounter (Signed)
Copied from CRM [id number]. Topic: Clinical - Medication Refill >> Jul 21, 2023 11:39 AM Irine Seal wrote: Most Recent Primary Care Visit:  Provider: Rodman Pickle A  Department: [facility]  Visit Type: OFFICE VISIT  Date: 08/20/2022  Medication: amLODipine (NORVASC) 5 MG tablet [[id number]]  Has the patient contacted their pharmacy? yes (Agent: If no, request that the patient contact the pharmacy for the refill. If patient does not wish to contact the pharmacy document the reason why and proceed with request.) (Agent: If yes, when and what did the pharmacy advise?)  Is this the correct pharmacy for this prescription? Yes If no, delete pharmacy and type the correct one.  This is the patient's preferred pharmacy:  [facility] [address] [place] [address] Phone: [phone] Fax: [phone]   Has the prescription been filled recently? No  Is the patient out of the medication? Yes  Has the patient been seen for an appointment in the last year OR does the patient have an upcoming appointment? Yes  Can we respond through MyChart? Yes  Agent: Please be advised that Rx refills may take up to 3 business days. We ask that you follow-up with your pharmacy.

## 2023-07-21 NOTE — Telephone Encounter (Signed)
Schedule pt for an OV with AIzola Price tomorrow 07/22/23 for HTN fu and med refill.

## 2023-07-22 ENCOUNTER — Ambulatory Visit: Payer: Federal, State, Local not specified - PPO | Admitting: Internal Medicine

## 2023-07-22 ENCOUNTER — Other Ambulatory Visit (HOSPITAL_COMMUNITY): Payer: Self-pay

## 2023-07-22 ENCOUNTER — Encounter: Payer: Self-pay | Admitting: Internal Medicine

## 2023-07-22 VITALS — BP 140/90 | HR 65 | Temp 98.7°F | Ht 60.0 in | Wt 197.8 lb

## 2023-07-22 DIAGNOSIS — I10 Essential (primary) hypertension: Secondary | ICD-10-CM

## 2023-07-22 DIAGNOSIS — R7303 Prediabetes: Secondary | ICD-10-CM

## 2023-07-22 DIAGNOSIS — Z23 Encounter for immunization: Secondary | ICD-10-CM | POA: Diagnosis not present

## 2023-07-22 DIAGNOSIS — Z1231 Encounter for screening mammogram for malignant neoplasm of breast: Secondary | ICD-10-CM | POA: Diagnosis not present

## 2023-07-22 DIAGNOSIS — M545 Low back pain, unspecified: Secondary | ICD-10-CM | POA: Diagnosis not present

## 2023-07-22 DIAGNOSIS — G8929 Other chronic pain: Secondary | ICD-10-CM

## 2023-07-22 DIAGNOSIS — Z1211 Encounter for screening for malignant neoplasm of colon: Secondary | ICD-10-CM

## 2023-07-22 LAB — HEMOGLOBIN A1C: Hgb A1c MFr Bld: 6.5 % (ref 4.6–6.5)

## 2023-07-22 LAB — COMPREHENSIVE METABOLIC PANEL
ALT: 21 U/L (ref 0–35)
AST: 24 U/L (ref 0–37)
Albumin: 3.9 g/dL (ref 3.5–5.2)
Alkaline Phosphatase: 91 U/L (ref 39–117)
BUN: 21 mg/dL (ref 6–23)
CO2: 32 meq/L (ref 19–32)
Calcium: 9.4 mg/dL (ref 8.4–10.5)
Chloride: 102 meq/L (ref 96–112)
Creatinine, Ser: 0.89 mg/dL (ref 0.40–1.20)
GFR: 69.22 mL/min (ref >60.00)
Glucose, Bld: 105 mg/dL — ABNORMAL HIGH (ref 70–99)
Potassium: 3.8 meq/L (ref 3.5–5.1)
Sodium: 138 meq/L (ref 135–145)
Total Bilirubin: 0.3 mg/dL (ref 0.2–1.2)
Total Protein: 7.3 g/dL (ref 6.0–8.3)

## 2023-07-22 MED ORDER — AMLODIPINE BESYLATE 5 MG PO TABS
5.0000 mg | ORAL_TABLET | Freq: Every day | ORAL | 1 refills | Status: AC
  Filled 2023-07-22: qty 90, 90d supply, fill #0
  Filled 2023-10-29: qty 90, 90d supply, fill #1

## 2023-07-22 NOTE — Progress Notes (Deleted)
 [facility] [address] [place] [address] Dept: [phone] Dept Fax: [phone]  Office Visit  Subjective:   Nancy Osborne May 19, 1961 07/22/2023  No chief complaint on file.   HPI: Discussed the use of AI scribe software for clinical note transcription with the patient, who gave verbal consent to proceed.  History of Present Illness             The following portions of the patient's history were reviewed and updated as appropriate: past medical history, past surgical history, family history, social history, allergies, medications, and problem list.   Patient Active Problem List   Diagnosis Date Noted   Gastroesophageal reflux disease 06/03/2022   Esophageal spasm 06/03/2022   Chronic idiopathic constipation 06/03/2022   Chest pain 06/03/2022   Abdominal bloating 06/03/2022   Raynaud's disease without gangrene 05/06/2022   Prediabetes 04/10/2022   Hyperlipidemia 04/10/2022   Muscle spasm of back 07/23/2018   Somatic symptom disorder 07/23/2018   Complicated grieving 07/23/2018   Poor appetite 07/23/2018   Essential hypertension 10/21/2016   Past Medical History:  Diagnosis Date   Hyperlipidemia    Hypertension    Prediabetes    Past Surgical History:  Procedure Laterality Date   CESAREAN SECTION     Family History  Problem Relation Age of Onset   Hypertension Mother    COPD Father     Current Outpatient Medications:    acetaminophen (TYLENOL) 650 MG CR tablet, Take 650 mg by mouth every 8 (eight) hours as needed for pain., Disp: , Rfl:    amLODipine (NORVASC) 5 MG tablet, Take 1 tablet (5 mg total) by mouth daily., Disp: 30 tablet, Rfl: 0   amoxicillin (AMOXIL) 500 MG capsule, Take 2 capsule (1,000 mg total) by mouth now THEN take 1 capsule every 8 hours until gone, Disp: 30 capsule, Rfl: 0   atovaquone-proguanil (MALARONE) 250-100 MG TABS tablet, Take 1 tablet by mouth daily. Take 1 tablet  the day before you leave, every day during your trip, then for 7 days after getting home, Disp: 35 tablet, Rfl: 0 Allergies  Allergen Reactions   Sertraline Hcl Other (See Comments)    Anxiety and paranoia     ROS: A complete ROS was performed with pertinent positives/negatives noted in the HPI. The remainder of the ROS are negative.    Objective:   There were no vitals filed for this visit.  GENERAL: Well-appearing, in NAD. Well nourished.  SKIN: Pink, warm and dry. No rash, lesion, ulceration, or ecchymoses.  NECK: Trachea midline. Full ROM w/o pain or tenderness. No lymphadenopathy.  RESPIRATORY: Chest wall symmetrical. Respirations even and non-labored. Breath sounds clear to auscultation bilaterally.  CARDIAC: S1, S2 present, regular rate and rhythm. Peripheral pulses 2+ bilaterally.  EXTREMITIES: Without clubbing, cyanosis, or edema.  NEUROLOGIC: No motor or sensory deficits. Steady, even gait.  PSYCH/MENTAL STATUS: Alert, oriented x 3. Cooperative, appropriate mood and affect.    No results found for any visits on 07/22/23.    Assessment & Plan:  Assessment and Plan               Essential hypertension   No orders of the defined types were placed in this encounter.  No orders of the defined types were placed in this encounter.  Lab Orders  No laboratory test(s) ordered today   No images are attached to the encounter or orders placed in the encounter.  No follow-ups on file.  Salvatore Decent, FNP

## 2023-07-22 NOTE — Progress Notes (Signed)
[facility] [address] [place] [address] Dept: [phone] Dept Fax: [phone]  Acute Care Office Visit  Subjective:   ALYRIC PARKIN [birth year] 07/22/2023  Chief Complaint  Patient presents with   Follow-up   Medication Refill    HPI: Discussed the use of AI scribe software for clinical note transcription with the patient, who gave verbal consent to proceed.  History of Present Illness   The patient, with a history of hypertension, hyperlipidemia, and prediabetes, presents for a medication refill. She reports that she ran out of amlodipine 5mg  and missed three to four days of medication. When adherent to the medication, her home blood pressure readings are 120/80's. However, her blood pressure was 151/90 prior to the appointment.  The patient also reports chronic back pain, which she attributes to standing for long periods at work at [employer]. She works 12 hour shifts.  She has been using shoe inserts for relief. She has a note from a previous provider limiting her lifting to no more than ten pounds and bending for no more than two to three minutes. She requests an updated note for her employer.  She reports a history of high cholesterol and prediabetes, with the last A1c in 2023 being 6.1. She is currently not on any medications for HLD or preDM. She is not fasting today.       The following portions of the patient's history were reviewed and updated as appropriate: past medical history, past surgical history, family history, social history, allergies, medications, and problem list.   Patient Active Problem List   Diagnosis Date Noted   Gastroesophageal reflux disease 06/03/2022   Esophageal spasm 06/03/2022   Chronic idiopathic constipation 06/03/2022   Chest pain 06/03/2022   Abdominal bloating 06/03/2022   Raynaud's disease without gangrene 05/06/2022   Prediabetes 04/10/2022   Hyperlipidemia  04/10/2022   Muscle spasm of back 07/23/2018   Somatic symptom disorder 07/23/2018   Complicated grieving 07/23/2018   Poor appetite 07/23/2018   Essential hypertension 10/21/2016   Past Medical History:  Diagnosis Date   Hyperlipidemia    Hypertension    Prediabetes    Past Surgical History:  Procedure Laterality Date   CESAREAN SECTION     Family History  Problem Relation Age of Onset   Hypertension Mother    COPD Father     Current Outpatient Medications:    acetaminophen (TYLENOL) 650 MG CR tablet, Take 650 mg by mouth every 8 (eight) hours as needed for pain., Disp: , Rfl:    amLODipine (NORVASC) 5 MG tablet, Take 1 tablet (5 mg total) by mouth daily., Disp: 90 tablet, Rfl: 1   amoxicillin (AMOXIL) 500 MG capsule, Take 2 capsule (1,000 mg total) by mouth now THEN take 1 capsule every 8 hours until gone (Patient not taking: Reported on 07/22/2023), Disp: 30 capsule, Rfl: 0   atovaquone-proguanil (MALARONE) 250-100 MG TABS tablet, Take 1 tablet by mouth daily. Take 1 tablet the day before you leave, every day during your trip, then for 7 days after getting home (Patient not taking: Reported on 07/22/2023), Disp: 35 tablet, Rfl: 0 Allergies  Allergen Reactions   Sertraline Hcl Other (See Comments)    Anxiety and paranoia     ROS: A complete ROS was performed with pertinent positives/negatives noted in the HPI. The remainder of the ROS are negative.    Objective:   Today's Vitals   07/22/23 1335 07/22/23 1402  BP: (!) 152/94 Marland Kitchen)  140/90  Pulse: 65   Temp: 98.7 F (37.1 C)   TempSrc: Temporal   SpO2: 100%   Weight: 197 lb 12.8 oz (89.7 kg)   Height: 5' (1.524 m)     GENERAL: Well-appearing, in NAD. Well nourished.  SKIN: Pink, warm and dry. No rash, lesion, ulceration, or ecchymoses.  NECK: Trachea midline. Full ROM w/o pain or tenderness. No lymphadenopathy.  RESPIRATORY: Chest wall symmetrical. Respirations even and non-labored. Breath sounds clear to auscultation  bilaterally.  CARDIAC: S1, S2 present, regular rate and rhythm. Peripheral pulses 2+ bilaterally.  EXTREMITIES: Without clubbing, cyanosis, or edema.  NEUROLOGIC: Steady, even gait.  PSYCH/MENTAL STATUS: Alert, oriented x 3. Cooperative, appropriate mood and affect.    No results found for any visits on 07/22/23.    Assessment & Plan:  Assessment and Plan    Hypertension Elevated blood pressure due to missed doses of Amlodipine 5mg . Blood pressure controlled when medication is taken consistently. -Resume Amlodipine 5mg  daily. -Send prescription to [facility] pharmacy at [facility].  Prediabetes Last A1c in 2023 was 6.1. -Check A1c today. -Follow up with Lauren in 3 months for cholesterol check (fasting).  Back Pain Chronic back pain exacerbated by standing for long periods at work. No recent changes in symptoms. -Continue current work restrictions (no lifting more than 10 pounds, no bending for more than 2-3 minutes). -Provide updated note for employer.  General Health Maintenance -Administer influenza vaccine today. -Order mammogram. -Order Cologuard for colon cancer screening. -Recommend follow up with gynecologist for routine checkup.         Meds ordered this encounter  Medications   amLODipine (NORVASC) 5 MG tablet    Sig: Take 1 tablet (5 mg total) by mouth daily.    Dispense:  90 tablet    Refill:  1    Supervising Provider:   Garnette Gunner [[id number]]   Orders Placed This Encounter  Procedures   MM 3D SCREENING MAMMOGRAM BILATERAL BREAST    Standing Status:   Future    Expiration Date:   07/21/2024    Reason for Exam (SYMPTOM  OR DIAGNOSIS REQUIRED):   screening for breast cancer    Preferred imaging location?:   [facility]   Flu vaccine trivalent PF, 6mos and older(Flulaval,Afluria,Fluarix,Fluzone)   Comp Met (CMET)   Hemoglobin A1C   Cologuard   Lab Orders         Comp Met (CMET)         Hemoglobin A1C         Cologuard     No images are  attached to the encounter or orders placed in the encounter.  Return in about 3 months (around 10/20/2023) for Chronic Condition follow up.   Salvatore Decent, FNP

## 2023-07-31 ENCOUNTER — Encounter: Payer: Self-pay | Admitting: Nurse Practitioner

## 2023-08-11 ENCOUNTER — Ambulatory Visit: Payer: Federal, State, Local not specified - PPO

## 2023-08-15 LAB — COLOGUARD: COLOGUARD: POSITIVE — AB

## 2023-08-17 ENCOUNTER — Other Ambulatory Visit: Payer: Self-pay | Admitting: Internal Medicine

## 2023-08-17 DIAGNOSIS — R195 Other fecal abnormalities: Secondary | ICD-10-CM

## 2023-08-18 ENCOUNTER — Telehealth: Payer: Self-pay

## 2023-08-18 DIAGNOSIS — Z1211 Encounter for screening for malignant neoplasm of colon: Secondary | ICD-10-CM

## 2023-08-18 NOTE — Telephone Encounter (Signed)
 Copied from CRM [id number]. Topic: Clinical - Request for Lab/Test Order >> Aug 18, 2023  4:25 PM Almira Coaster wrote: Reason for CRM: Patient is calling to get a new cologuard test kit, states that before she sent out the sample she had it at home for 3 weeks and would like to restest if possible.  Forwarding message above.

## 2023-08-19 NOTE — Telephone Encounter (Signed)
 Pt collected stool sample and wasn't able to send kit until 2-3 days later; due to taking care of husband and forgetfulness. Pt also mentioned results came back positive and could be due to bacteria collection from sitting overtime.

## 2023-08-20 NOTE — Telephone Encounter (Signed)
 I called [employer] and spoke to [employer] regarding patient's concern. Marcelino Duster said that patient has a 96 hour turn around time to send sample back to company after collection. The sample was sent back on 08/11/23 and received by lab on 08/12/23. She said if it sat out for 2-3 days that it was received and testing within time frame.

## 2023-08-20 NOTE — Addendum Note (Signed)
 Addended by: Rodman Pickle A on: 08/20/2023 04:29 PM   Modules accepted: Orders

## 2023-08-20 NOTE — Telephone Encounter (Signed)
 I called and spoke with patient and notified her of below message and she does not want a referral to have a colonoscopy. 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 Nancy Osborne wishes to have another order for a cologuard kit and will deal with insurance and the cost.

## 2023-08-20 NOTE — Telephone Encounter (Signed)
 LVM for patient to return call.

## 2023-08-20 NOTE — Telephone Encounter (Signed)
 I called and spoke with patient and notified her that an order for a cologuard kit was placed and that someone will be in contact with her. Patient said thank you.

## 2023-08-21 ENCOUNTER — Ambulatory Visit: Payer: Federal, State, Local not specified - PPO

## 2023-08-31 ENCOUNTER — Ambulatory Visit: Payer: Federal, State, Local not specified - PPO

## 2023-09-09 ENCOUNTER — Ambulatory Visit

## 2023-09-22 ENCOUNTER — Ambulatory Visit: Payer: Federal, State, Local not specified - PPO | Admitting: Nurse Practitioner

## 2023-10-19 ENCOUNTER — Ambulatory Visit: Payer: Federal, State, Local not specified - PPO | Admitting: Nurse Practitioner

## 2023-10-29 ENCOUNTER — Other Ambulatory Visit (HOSPITAL_COMMUNITY): Payer: Self-pay

## 2023-10-30 ENCOUNTER — Other Ambulatory Visit (HOSPITAL_COMMUNITY): Payer: Self-pay

## 2023-11-10 ENCOUNTER — Telehealth: Payer: Self-pay | Admitting: Nurse Practitioner

## 2023-11-10 NOTE — Telephone Encounter (Signed)
 07/22/23 late arrival 10/19/23 no show  Final warning letter sent via mail

## 2023-11-11 NOTE — Telephone Encounter (Signed)
 Noted

## 2023-12-02 ENCOUNTER — Other Ambulatory Visit (HOSPITAL_COMMUNITY): Payer: Self-pay

## 2024-03-07 ENCOUNTER — Inpatient Hospital Stay: Admission: RE | Admit: 2024-03-07 | Source: Ambulatory Visit

## 2024-04-11 ENCOUNTER — Encounter

## 2024-07-18 ENCOUNTER — Encounter: Payer: Self-pay | Admitting: Internal Medicine
